# Patient Record
Sex: Female | Born: 1950 | Race: White | Hispanic: No | Smoking: Former smoker
Health system: Southern US, Community
[De-identification: ages and names within clinical notes are randomized; demographics above are authoritative.]

## PROBLEM LIST (undated history)

## (undated) DIAGNOSIS — E039 Hypothyroidism, unspecified: Secondary | ICD-10-CM

## (undated) DIAGNOSIS — R002 Palpitations: Secondary | ICD-10-CM

## (undated) DIAGNOSIS — M1990 Unspecified osteoarthritis, unspecified site: Secondary | ICD-10-CM

## (undated) DIAGNOSIS — I10 Essential (primary) hypertension: Secondary | ICD-10-CM

## (undated) DIAGNOSIS — G4730 Sleep apnea, unspecified: Secondary | ICD-10-CM

## (undated) DIAGNOSIS — S41112A Laceration without foreign body of left upper arm, initial encounter: Secondary | ICD-10-CM

## (undated) DIAGNOSIS — R011 Cardiac murmur, unspecified: Secondary | ICD-10-CM

## (undated) DIAGNOSIS — S81802A Unspecified open wound, left lower leg, initial encounter: Secondary | ICD-10-CM

## (undated) DIAGNOSIS — K219 Gastro-esophageal reflux disease without esophagitis: Secondary | ICD-10-CM

## (undated) DIAGNOSIS — R32 Unspecified urinary incontinence: Secondary | ICD-10-CM

## (undated) DIAGNOSIS — Z8719 Personal history of other diseases of the digestive system: Secondary | ICD-10-CM

## (undated) DIAGNOSIS — I499 Cardiac arrhythmia, unspecified: Secondary | ICD-10-CM

## (undated) HISTORY — PX: CARPAL TUNNEL RELEASE: SHX101

---

## 2002-12-16 HISTORY — PX: HAND FUSION: SHX975

## 2003-12-17 HISTORY — PX: BACK SURGERY: SHX140

## 2006-08-22 ENCOUNTER — Ambulatory Visit (HOSPITAL_BASED_OUTPATIENT_CLINIC_OR_DEPARTMENT_OTHER): Admission: RE | Admit: 2006-08-22 | Discharge: 2006-08-22 | Payer: Self-pay | Admitting: Orthopaedic Surgery

## 2006-10-27 ENCOUNTER — Ambulatory Visit (HOSPITAL_BASED_OUTPATIENT_CLINIC_OR_DEPARTMENT_OTHER): Admission: RE | Admit: 2006-10-27 | Discharge: 2006-10-27 | Payer: Self-pay | Admitting: Orthopaedic Surgery

## 2007-02-04 ENCOUNTER — Ambulatory Visit (HOSPITAL_COMMUNITY): Admission: RE | Admit: 2007-02-04 | Discharge: 2007-02-05 | Payer: Self-pay | Admitting: Orthopaedic Surgery

## 2009-02-13 DEATH — deceased

## 2012-08-07 ENCOUNTER — Ambulatory Visit: Payer: BC Managed Care – PPO | Attending: Family Medicine | Admitting: Sleep Medicine

## 2012-08-07 DIAGNOSIS — G4733 Obstructive sleep apnea (adult) (pediatric): Secondary | ICD-10-CM | POA: Insufficient documentation

## 2012-08-07 DIAGNOSIS — G4730 Sleep apnea, unspecified: Secondary | ICD-10-CM

## 2012-08-07 DIAGNOSIS — Z6832 Body mass index (BMI) 32.0-32.9, adult: Secondary | ICD-10-CM | POA: Insufficient documentation

## 2012-08-13 NOTE — Procedures (Signed)
[facility] Jame Seelig A. Gerilyn Pilgrim, MD     [email or web address]          NAMEDECEMBER, HEDTKE                ACCOUNT NO.:  [id number]  MEDICAL RECORD NO.:  [id number]          PATIENT TYPE:  OUT  LOCATION:  SLEEP LAB                     FACILITY:  [facility]  PHYSICIAN:  Fumio Vandam A. Gerilyn Pilgrim, M.D. DATE OF BIRTH:  09/24/1951  DATE OF STUDY:  08/07/2012                           NOCTURNAL POLYSOMNOGRAM  REFERRING PHYSICIAN:  Raynelle Jan, M.D.  INDICATIONS:  A 61 year old, who presents with obesity, hypersomnia, and fatigue.  The study is being done to evaluate for obstructive sleep apnea syndrome.   MEDICATIONS:  Venlafaxine, pravastatin, levothyroxine, Diovan.  EPWORTH SLEEPINESS SCALE:  13.  BMI:  32.  ARCHITECTURAL SUMMARY:  Total recording time is 424 minutes.  Sleep efficiency 66%.  Sleep latency 51 minutes.  REM latency 158 minutes. Stage N1 12%, N2 66%, N3 80%, and REM sleep 3%  RESPIRATORY SUMMARY:  Baseline oxygen saturation is 95, lowest saturation 88 during non-REM sleep.  Diagnostic AHI 24 and RDI 26.  LIMB MOVEMENT SUMMARY:  PLM index 0.  ELECTROCARDIOGRAM SUMMARY:  Average heart rate is 75 with no significant dysrhythmias observed.  IMPRESSION:  Moderate obstructive sleep apnea syndrome.  RECOMMENDATION:  Formal CPAP titration study.  Thank you for this referral.    Lus Kriegel A. Gerilyn Pilgrim, M.D.    KAD/MEDQ  D:  08/13/2012 09:12:50  T:  08/13/2012 09:41:17  Job:  [id number]

## 2017-02-13 NOTE — H&P (Signed)
  Robin Evans is an 66 y.o. female.    Chief Complaint: right knee pain  HPI: Pt is a 66 y.o. female complaining of right knee pain for multiple years. Pain had continually increased since the beginning. X-rays in the clinic show end-stage arthritic changes of the right knee. Pt has tried various conservative treatments which have failed to alleviate their symptoms, including injections and therapy. Various options are discussed with the patient. Risks, benefits and expectations were discussed with the patient. Patient understand the risks, benefits and expectations and wishes to proceed with surgery.   PCP:  Raynelle JanSPRY,HEATHER M., MD  D/C Plans: Home  PMH: No past medical history on file.  PSH: No past surgical history on file.  Social History:  has no tobacco, alcohol, and drug history on file.  Allergies:  Allergies not on file  Medications: No current facility-administered medications for this encounter.    No current outpatient prescriptions on file.    No results found for this or any previous visit (from the past 48 hour(s)). No results found.  ROS: Pain with rom of the right lower extremity  Physical Exam:  Alert and oriented 66 y.o. female in no acute distress Cranial nerves 2-12 intact Cervical spine: full rom with no tenderness, nv intact distally Chest: active breath sounds bilaterally, no wheeze rhonchi or rales Heart: regular rate and rhythm, no murmur Abd: non tender non distended with active bowel sounds Hip is stable with rom  Right knee medial and lateral joint line tenderness nv intact distally Antalgic gait  Assessment/Plan Assessment: right knee end stage osteoarthritis  Plan: Patient will undergo a right total knee by [facility]. Risks benefits and expectations were discussed with the patient. Patient understand risks, benefits and expectations and wishes to proceed.

## 2017-02-25 NOTE — Pre-Procedure Instructions (Addendum)
Patrecia PaceJoni Warman  02/25/2017      [id number] - [place], [place] - [address] [facility] [id number] Phone: [phone] Fax: [phone]    Your procedure is scheduled on Friday March 23.  [facility] Ad[facility] at 5:30 A.M.  Call this number if you have problems the morning of surgery:  [phone]   Remember:  Do not eat food or drink liquids after midnight.  Take these medicines the morning of surgery with A SIP OF WATER: levothyroxine (synthroid), omeprazole (prilosec)                                              (02/28/17) 7 days prior to surgery STOP taking any Aspirin, Aleve, Naproxen, Ibuprofen, Motrin, Advil, Goody's, BC's, all herbal medications, fish oil, and all vitamins    Do not wear jewelry, make-up or nail polish.  Do not wear lotions, powders, or perfumes, or deoderant.  Do not shave 48 hours prior to surgery.  Men may shave face and neck.  Do not bring valuables to the hospital.  [facility] is not responsible for any belongings or valuables.  Contacts, dentures or bridgework may not be worn into surgery.  Leave your suitcase in the car.  After surgery it may be brought to your room.  For patients admitted to the hospital, discharge time will be determined by your treatment team.  Patients discharged the day of surgery will not be allowed to drive home.    Special instructions:    [facility]- Preparing For Surgery  Before surgery, you can play an important role. Because skin is not sterile, your skin needs to be as free of germs as possible. You can reduce the number of germs on your skin by washing with CHG (chlorahexidine gluconate) Soap before surgery.  CHG is an antiseptic cleaner which kills germs and bonds with the skin to continue killing germs even after washing.  Please do not use if you have an allergy to CHG or antibacterial soaps. If your skin becomes reddened/irritated stop using the CHG.   Do not shave (including legs and underarms) for at least 48 hours prior to first CHG shower. It is OK to shave your face.  Please follow these instructions carefully.   1. Shower the NIGHT BEFORE SURGERY and the MORNING OF SURGERY with CHG.   2. If you chose to wash your hair, wash your hair first as usual with your normal shampoo.  3. After you shampoo, rinse your hair and body thoroughly to remove the shampoo.  4. Use CHG as you would any other liquid soap. You can apply CHG directly to the skin and wash gently with a scrungie or a clean washcloth.   5. Apply the CHG Soap to your body ONLY FROM THE NECK DOWN.  Do not use on open wounds or open sores. Avoid contact with your eyes, ears, mouth and genitals (private parts). Wash genitals (private parts) with your normal soap.  6. Wash thoroughly, paying special attention to the area where your surgery will be performed.  7. Thoroughly rinse your body with warm water from the neck down.  8. DO NOT shower/wash with your normal soap after using and rinsing off the CHG Soap.  9. Pat yourself dry with a CLEAN TOWEL.   10. Wear CLEAN PAJAMAS  11. Place CLEAN SHEETS on your bed the night of your first shower and DO NOT SLEEP WITH PETS.    Day of Surgery: Do not apply any deodorants/lotions. Please wear clean clothes to the hospital/surgery center.      Please read over the  fact sheets that you were given.

## 2017-02-26 ENCOUNTER — Encounter (HOSPITAL_COMMUNITY): Payer: Self-pay | Admitting: *Deleted

## 2017-02-26 ENCOUNTER — Encounter (HOSPITAL_COMMUNITY)
Admission: RE | Admit: 2017-02-26 | Discharge: 2017-02-26 | Disposition: A | Discharge status: Home / Self Care | Payer: Worker's Compensation | Source: Ambulatory Visit | Attending: Orthopedic Surgery | Admitting: Orthopedic Surgery

## 2017-02-26 DIAGNOSIS — Z01812 Encounter for preprocedural laboratory examination: Secondary | ICD-10-CM | POA: Diagnosis present

## 2017-02-26 HISTORY — DX: Sleep apnea, unspecified: G47.30

## 2017-02-26 HISTORY — DX: Cardiac arrhythmia, unspecified: I49.9

## 2017-02-26 HISTORY — DX: Hypothyroidism, unspecified: E03.9

## 2017-02-26 HISTORY — DX: Personal history of other diseases of the digestive system: Z87.19

## 2017-02-26 HISTORY — DX: Essential (primary) hypertension: I10

## 2017-02-26 HISTORY — DX: Gastro-esophageal reflux disease without esophagitis: K21.9

## 2017-02-26 HISTORY — DX: Unspecified osteoarthritis, unspecified site: M19.90

## 2017-02-26 LAB — CBC
HCT: 41.7 % (ref 36.0–46.0)
Hemoglobin: 13.7 g/dL (ref 12.0–15.0)
MCH: 29.3 pg (ref 26.0–34.0)
MCHC: 32.9 g/dL (ref 30.0–36.0)
MCV: 89.3 fL (ref 78.0–100.0)
PLATELETS: 164 10*3/uL (ref 150–400)
RBC: 4.67 MIL/uL (ref 3.87–5.11)
RDW: 13.9 % (ref 11.5–15.5)
WBC: 6 10*3/uL (ref 4.0–10.5)

## 2017-02-26 LAB — SURGICAL PCR SCREEN
MRSA, PCR: NEGATIVE
STAPHYLOCOCCUS AUREUS: POSITIVE — AB

## 2017-02-26 LAB — BASIC METABOLIC PANEL
ANION GAP: 8 (ref 5–15)
BUN: 10 mg/dL (ref 6–20)
CO2: 29 mmol/L (ref 22–32)
CREATININE: 0.83 mg/dL (ref 0.44–1.00)
Calcium: 9.7 mg/dL (ref 8.9–10.3)
Chloride: 103 mmol/L (ref 101–111)
GFR calc Af Amer: >60 mL/min (ref >60)
GFR calc non Af Amer: >60 mL/min (ref >60)
Glucose, Bld: 107 mg/dL — ABNORMAL HIGH (ref 65–99)
POTASSIUM: 3.9 mmol/L (ref 3.5–5.1)
SODIUM: 140 mmol/L (ref 135–145)

## 2017-02-26 NOTE — Progress Notes (Signed)
req'd from pcp dr Herbert Setaheather spry [facility] [phone]

## 2017-03-06 MED ORDER — TRANEXAMIC ACID 1000 MG/10ML IV SOLN
1000.0000 mg | INTRAVENOUS | Status: AC
  Administered 2017-03-07: 1000 mg via INTRAVENOUS
  Filled 2017-03-06: qty 10

## 2017-03-06 NOTE — Anesthesia Preprocedure Evaluation (Addendum)
Anesthesia Evaluation    Reviewed: Allergy & Precautions, Patient's Chart, lab work & pertinent test results  Airway Mallampati: III  TM Distance: >3 FB Neck ROM: Full    Dental  (+) Teeth Intact, Dental Advisory Given   Pulmonary sleep apnea and Continuous Positive Airway Pressure Ventilation , former smoker,    Pulmonary exam normal breath sounds clear to auscultation       Cardiovascular hypertension, Pt. on medications (-) angina(-) CAD, (-) Past MI and (-) CHF Normal cardiovascular exam Rhythm:Regular Rate:Normal     Neuro/Psych negative neurological ROS  negative psych ROS   GI/Hepatic Neg liver ROS, hiatal hernia, GERD  Medicated,  Endo/Other  Hypothyroidism Obesity   Renal/GU negative Renal ROS     Musculoskeletal  (+) Arthritis , Osteoarthritis,    Abdominal   Peds  Hematology negative hematology ROS (+) Plt 164k   Anesthesia Other Findings Day of surgery medications reviewed with the patient.  Reproductive/Obstetrics                            Anesthesia Physical Anesthesia Plan  ASA: II  Anesthesia Plan: Spinal and MAC   Post-op Pain Management:  Regional for Post-op pain   Induction: Intravenous  Airway Management Planned: Simple Face Mask  Additional Equipment:   Intra-op Plan:   Post-operative Plan:   Informed Consent: I have reviewed the patients History and Physical, chart, labs and discussed the procedure including the risks, benefits and alternatives for the proposed anesthesia with the patient or authorized representative who has indicated his/her understanding and acceptance.   Dental advisory given  Plan Discussed with: CRNA, Anesthesiologist and Surgeon  Anesthesia Plan Comments: (Spinal with Adductor canal block.)        Anesthesia Quick Evaluation

## 2017-03-07 ENCOUNTER — Inpatient Hospital Stay (HOSPITAL_COMMUNITY)
Admission: RE | Admit: 2017-03-07 | Discharge: 2017-03-10 | DRG: 470 | Disposition: A | Discharge status: Skilled Nursing Facility | Payer: Worker's Compensation | Source: Ambulatory Visit | Attending: Orthopedic Surgery | Admitting: Orthopedic Surgery

## 2017-03-07 ENCOUNTER — Inpatient Hospital Stay (HOSPITAL_COMMUNITY): Payer: Worker's Compensation | Admitting: Anesthesiology

## 2017-03-07 ENCOUNTER — Inpatient Hospital Stay (HOSPITAL_COMMUNITY): Payer: Worker's Compensation

## 2017-03-07 ENCOUNTER — Encounter (HOSPITAL_COMMUNITY): Payer: Self-pay | Admitting: *Deleted

## 2017-03-07 ENCOUNTER — Encounter (HOSPITAL_COMMUNITY)
Admission: RE | Disposition: A | Discharge status: Skilled Nursing Facility | Payer: Self-pay | Source: Ambulatory Visit | Attending: Orthopedic Surgery

## 2017-03-07 DIAGNOSIS — Z6831 Body mass index (BMI) 31.0-31.9, adult: Secondary | ICD-10-CM

## 2017-03-07 DIAGNOSIS — Z96651 Presence of right artificial knee joint: Secondary | ICD-10-CM

## 2017-03-07 DIAGNOSIS — I10 Essential (primary) hypertension: Secondary | ICD-10-CM | POA: Diagnosis present

## 2017-03-07 DIAGNOSIS — K219 Gastro-esophageal reflux disease without esophagitis: Secondary | ICD-10-CM | POA: Diagnosis present

## 2017-03-07 DIAGNOSIS — E039 Hypothyroidism, unspecified: Secondary | ICD-10-CM | POA: Diagnosis present

## 2017-03-07 DIAGNOSIS — Z87891 Personal history of nicotine dependence: Secondary | ICD-10-CM | POA: Diagnosis not present

## 2017-03-07 DIAGNOSIS — M1711 Unilateral primary osteoarthritis, right knee: Secondary | ICD-10-CM | POA: Diagnosis present

## 2017-03-07 DIAGNOSIS — M1712 Unilateral primary osteoarthritis, left knee: Secondary | ICD-10-CM | POA: Diagnosis present

## 2017-03-07 DIAGNOSIS — E669 Obesity, unspecified: Secondary | ICD-10-CM | POA: Diagnosis present

## 2017-03-07 DIAGNOSIS — M25561 Pain in right knee: Secondary | ICD-10-CM | POA: Diagnosis present

## 2017-03-07 HISTORY — PX: INJECTION KNEE: SHX2446

## 2017-03-07 HISTORY — PX: TOTAL KNEE ARTHROPLASTY: SHX125

## 2017-03-07 SURGERY — ARTHROPLASTY, KNEE, TOTAL
Anesthesia: Spinal | Site: Knee | Laterality: Right

## 2017-03-07 MED ORDER — ASPIRIN 81 MG PO CHEW: 81.0000 mg | CHEWABLE_TABLET | Freq: Two times a day (BID) | ORAL | 0 refills | Status: DC

## 2017-03-07 MED ORDER — IRBESARTAN 150 MG PO TABS
150.0000 mg | ORAL_TABLET | Freq: Every day | ORAL | Status: DC
  Administered 2017-03-08 – 2017-03-10 (×3): 150 mg via ORAL
  Filled 2017-03-07 (×3): qty 1

## 2017-03-07 MED ORDER — OXYCODONE HCL 5 MG PO TABS
5.0000 mg | ORAL_TABLET | ORAL | Status: DC | PRN
  Administered 2017-03-07 (×4): 5 mg via ORAL
  Administered 2017-03-08: 10 mg via ORAL
  Administered 2017-03-08: 5 mg via ORAL
  Administered 2017-03-08: 10 mg via ORAL
  Administered 2017-03-08 – 2017-03-09 (×5): 5 mg via ORAL
  Filled 2017-03-07 (×6): qty 1
  Filled 2017-03-07 (×2): qty 2
  Filled 2017-03-07 (×4): qty 1

## 2017-03-07 MED ORDER — FENTANYL CITRATE (PF) 100 MCG/2ML IJ SOLN
INTRAMUSCULAR | Status: AC
  Filled 2017-03-07: qty 2

## 2017-03-07 MED ORDER — CEFAZOLIN SODIUM-DEXTROSE 2-4 GM/100ML-% IV SOLN
2.0000 g | INTRAVENOUS | Status: AC
  Administered 2017-03-07: 2 g via INTRAVENOUS
  Filled 2017-03-07: qty 100

## 2017-03-07 MED ORDER — DOCUSATE SODIUM 100 MG PO CAPS
100.0000 mg | ORAL_CAPSULE | Freq: Two times a day (BID) | ORAL | Status: DC
  Administered 2017-03-07 – 2017-03-10 (×6): 100 mg via ORAL
  Filled 2017-03-07 (×5): qty 1

## 2017-03-07 MED ORDER — PANTOPRAZOLE SODIUM 40 MG PO TBEC
40.0000 mg | DELAYED_RELEASE_TABLET | Freq: Every day | ORAL | Status: DC
Start: 2017-03-08 — End: 2017-03-10
  Administered 2017-03-08 – 2017-03-10 (×3): 40 mg via ORAL
  Filled 2017-03-07 (×3): qty 1

## 2017-03-07 MED ORDER — ASPIRIN 81 MG PO CHEW
81.0000 mg | CHEWABLE_TABLET | Freq: Two times a day (BID) | ORAL | Status: DC
  Administered 2017-03-07 – 2017-03-10 (×6): 81 mg via ORAL
  Filled 2017-03-07 (×6): qty 1

## 2017-03-07 MED ORDER — LACTATED RINGERS IV SOLN
INTRAVENOUS | Status: DC | PRN
Start: 2017-03-07 — End: 2017-03-07
  Administered 2017-03-07: 07:00:00 via INTRAVENOUS

## 2017-03-07 MED ORDER — TRANEXAMIC ACID 1000 MG/10ML IV SOLN
1000.0000 mg | Freq: Once | INTRAVENOUS | Status: AC
  Administered 2017-03-07: 1000 mg via INTRAVENOUS
  Filled 2017-03-07: qty 10

## 2017-03-07 MED ORDER — ONDANSETRON HCL 4 MG/2ML IJ SOLN: 4.0000 mg | Freq: Once | INTRAMUSCULAR | Status: DC | PRN

## 2017-03-07 MED ORDER — METHOCARBAMOL 500 MG PO TABS
500.0000 mg | ORAL_TABLET | Freq: Four times a day (QID) | ORAL | Status: DC | PRN
  Administered 2017-03-07 – 2017-03-09 (×5): 500 mg via ORAL
  Filled 2017-03-07 (×6): qty 1

## 2017-03-07 MED ORDER — EPHEDRINE SULFATE-NACL 50-0.9 MG/10ML-% IV SOSY
PREFILLED_SYRINGE | INTRAVENOUS | Status: DC | PRN
  Administered 2017-03-07: 5 mg via INTRAVENOUS

## 2017-03-07 MED ORDER — EPHEDRINE 5 MG/ML INJ
INTRAVENOUS | Status: AC
  Filled 2017-03-07: qty 10

## 2017-03-07 MED ORDER — FENTANYL CITRATE (PF) 100 MCG/2ML IJ SOLN
25.0000 ug | INTRAMUSCULAR | Status: DC | PRN
  Administered 2017-03-07 (×3): 50 ug via INTRAVENOUS

## 2017-03-07 MED ORDER — ACETAMINOPHEN 325 MG PO TABS
650.0000 mg | ORAL_TABLET | Freq: Four times a day (QID) | ORAL | Status: DC | PRN
  Administered 2017-03-10 (×2): 650 mg via ORAL
  Filled 2017-03-07 (×2): qty 2

## 2017-03-07 MED ORDER — ONDANSETRON HCL 4 MG PO TABS: 4.0000 mg | ORAL_TABLET | Freq: Four times a day (QID) | ORAL | Status: DC | PRN

## 2017-03-07 MED ORDER — MENTHOL 3 MG MT LOZG: 1.0000 | LOZENGE | OROMUCOSAL | Status: DC | PRN

## 2017-03-07 MED ORDER — MIDAZOLAM HCL 2 MG/2ML IJ SOLN
INTRAMUSCULAR | Status: AC
  Filled 2017-03-07: qty 2

## 2017-03-07 MED ORDER — OXYCODONE-ACETAMINOPHEN 5-325 MG PO TABS: 1.0000 | ORAL_TABLET | ORAL | 0 refills | Status: DC | PRN

## 2017-03-07 MED ORDER — PRAVASTATIN SODIUM 40 MG PO TABS
40.0000 mg | ORAL_TABLET | Freq: Every evening | ORAL | Status: DC
  Administered 2017-03-07 – 2017-03-10 (×4): 40 mg via ORAL
  Filled 2017-03-07 (×4): qty 1

## 2017-03-07 MED ORDER — LIDOCAINE HCL 2 % IJ SOLN
INTRAMUSCULAR | Status: AC
  Filled 2017-03-07: qty 20

## 2017-03-07 MED ORDER — SODIUM CHLORIDE 0.9 % IR SOLN
Status: DC | PRN
  Administered 2017-03-07: 3000 mL

## 2017-03-07 MED ORDER — LEVOTHYROXINE SODIUM 25 MCG PO TABS
125.0000 ug | ORAL_TABLET | Freq: Every day | ORAL | Status: DC
  Administered 2017-03-08 – 2017-03-10 (×3): 125 ug via ORAL
  Filled 2017-03-07 (×3): qty 1

## 2017-03-07 MED ORDER — CHLORHEXIDINE GLUCONATE 4 % EX LIQD: 60.0000 mL | Freq: Once | CUTANEOUS | Status: DC

## 2017-03-07 MED ORDER — METHOCARBAMOL 500 MG PO TABS: 500.0000 mg | ORAL_TABLET | Freq: Three times a day (TID) | ORAL | 1 refills | Status: DC | PRN

## 2017-03-07 MED ORDER — PROPOFOL 500 MG/50ML IV EMUL
INTRAVENOUS | Status: DC | PRN
  Administered 2017-03-07: 100 ug/kg/min via INTRAVENOUS

## 2017-03-07 MED ORDER — PROPOFOL 10 MG/ML IV BOLUS
INTRAVENOUS | Status: AC
  Filled 2017-03-07: qty 20

## 2017-03-07 MED ORDER — ONDANSETRON HCL 4 MG/2ML IJ SOLN: 4.0000 mg | Freq: Four times a day (QID) | INTRAMUSCULAR | Status: DC | PRN

## 2017-03-07 MED ORDER — FENTANYL CITRATE (PF) 100 MCG/2ML IJ SOLN
INTRAMUSCULAR | Status: DC | PRN
  Administered 2017-03-07 (×2): 50 ug via INTRAVENOUS

## 2017-03-07 MED ORDER — METOCLOPRAMIDE HCL 5 MG/ML IJ SOLN: 5.0000 mg | Freq: Three times a day (TID) | INTRAMUSCULAR | Status: DC | PRN

## 2017-03-07 MED ORDER — TRIAMCINOLONE ACETONIDE 40 MG/ML IJ SUSP
INTRAMUSCULAR | Status: AC
  Filled 2017-03-07: qty 5

## 2017-03-07 MED ORDER — ROPIVACAINE HCL 7.5 MG/ML IJ SOLN
INTRAMUSCULAR | Status: DC | PRN
Start: 2017-03-07 — End: 2017-03-07
  Administered 2017-03-07: 20 mL via PERINEURAL

## 2017-03-07 MED ORDER — PHENOL 1.4 % MT LIQD: 1.0000 | OROMUCOSAL | Status: DC | PRN

## 2017-03-07 MED ORDER — OXYCODONE HCL 5 MG PO TABS
ORAL_TABLET | ORAL | Status: AC
  Filled 2017-03-07: qty 1

## 2017-03-07 MED ORDER — METOCLOPRAMIDE HCL 5 MG PO TABS
5.0000 mg | ORAL_TABLET | Freq: Three times a day (TID) | ORAL | Status: DC | PRN
  Filled 2017-03-07: qty 2

## 2017-03-07 MED ORDER — ACETAMINOPHEN 650 MG RE SUPP: 650.0000 mg | Freq: Four times a day (QID) | RECTAL | Status: DC | PRN

## 2017-03-07 MED ORDER — DEXTROSE 5 % IV SOLN
500.0000 mg | Freq: Four times a day (QID) | INTRAVENOUS | Status: DC | PRN
  Filled 2017-03-07: qty 5

## 2017-03-07 MED ORDER — PHENYLEPHRINE HCL 10 MG/ML IJ SOLN
INTRAVENOUS | Status: DC | PRN
  Administered 2017-03-07: 25 ug/min via INTRAVENOUS

## 2017-03-07 MED ORDER — CEFAZOLIN SODIUM-DEXTROSE 2-4 GM/100ML-% IV SOLN
INTRAVENOUS | Status: AC
  Filled 2017-03-07: qty 100

## 2017-03-07 MED ORDER — POLYETHYLENE GLYCOL 3350 17 G PO PACK: 17.0000 g | PACK | Freq: Every day | ORAL | Status: DC | PRN

## 2017-03-07 MED ORDER — MIDAZOLAM HCL 5 MG/5ML IJ SOLN
INTRAMUSCULAR | Status: DC | PRN
  Administered 2017-03-07: 2 mg via INTRAVENOUS

## 2017-03-07 MED ORDER — 0.9 % SODIUM CHLORIDE (POUR BTL) OPTIME
TOPICAL | Status: DC | PRN
  Administered 2017-03-07: 1000 mL

## 2017-03-07 MED ORDER — OXYCODONE-ACETAMINOPHEN 5-325 MG PO TABS
ORAL_TABLET | ORAL | Status: AC
  Filled 2017-03-07: qty 1

## 2017-03-07 MED ORDER — BISACODYL 10 MG RE SUPP: 10.0000 mg | Freq: Every day | RECTAL | Status: DC | PRN

## 2017-03-07 MED ORDER — MORPHINE SULFATE (PF) 4 MG/ML IV SOLN: 3.0000 mg | INTRAVENOUS | Status: DC | PRN

## 2017-03-07 MED ORDER — SODIUM CHLORIDE 0.9 % IV SOLN
INTRAVENOUS | Status: DC
  Administered 2017-03-07: 15:00:00 via INTRAVENOUS

## 2017-03-07 MED ORDER — BUPIVACAINE HCL (PF) 0.5 % IJ SOLN
INTRAMUSCULAR | Status: AC
  Filled 2017-03-07: qty 10

## 2017-03-07 MED ORDER — CEFAZOLIN SODIUM-DEXTROSE 2-4 GM/100ML-% IV SOLN
2.0000 g | Freq: Four times a day (QID) | INTRAVENOUS | Status: AC
  Administered 2017-03-07 (×2): 2 g via INTRAVENOUS
  Filled 2017-03-07 (×2): qty 100

## 2017-03-07 SURGICAL SUPPLY — 69 items
BANDAGE ADH SHEER 1  50/CT (GAUZE/BANDAGES/DRESSINGS) ×4 IMPLANT
BANDAGE ESMARK 6X9 LF (GAUZE/BANDAGES/DRESSINGS) ×2 IMPLANT
BLADE SAG 18X100X1.27 (BLADE) ×4 IMPLANT
BLADE SAW SGTL 13X75X1.27 (BLADE) IMPLANT
BLADE SAW SGTL 18X1.27X75 (BLADE) ×3 IMPLANT
BLADE SAW SGTL 18X1.27X75MM (BLADE) ×1
BLADE SURG 10 STRL SS (BLADE) ×8 IMPLANT
BNDG ELASTIC 6X10 VLCR STRL LF (GAUZE/BANDAGES/DRESSINGS) ×4 IMPLANT
BNDG ESMARK 6X9 LF (GAUZE/BANDAGES/DRESSINGS) ×4
BNDG GAUZE ELAST 4 BULKY (GAUZE/BANDAGES/DRESSINGS) ×8 IMPLANT
BOWL SMART MIX CTS (DISPOSABLE) ×4 IMPLANT
CAPT KNEE TOTAL 3 ATTUNE ×4 IMPLANT
CEMENT HV SMART SET (Cement) ×8 IMPLANT
CHLORAPREP W/TINT 10.5 ML (MISCELLANEOUS) ×4 IMPLANT
CLOSURE WOUND 1/2 X4 (GAUZE/BANDAGES/DRESSINGS) ×1
COVER SURGICAL LIGHT HANDLE (MISCELLANEOUS) ×4 IMPLANT
CUFF TOURNIQUET SINGLE 34IN LL (TOURNIQUET CUFF) ×4 IMPLANT
CUFF TOURNIQUET SINGLE 44IN (TOURNIQUET CUFF) IMPLANT
DRAPE EXTREMITY T 121X128X90 (DRAPE) ×4 IMPLANT
DRAPE PROXIMA HALF (DRAPES) ×4 IMPLANT
DRAPE U-SHAPE 47X51 STRL (DRAPES) ×4 IMPLANT
DRSG ADAPTIC 3X8 NADH LF (GAUZE/BANDAGES/DRESSINGS) ×4 IMPLANT
DRSG PAD ABDOMINAL 8X10 ST (GAUZE/BANDAGES/DRESSINGS) ×4 IMPLANT
DURAPREP 26ML APPLICATOR (WOUND CARE) ×4 IMPLANT
ELECT CAUTERY BLADE 6.4 (BLADE) ×4 IMPLANT
ELECT REM PT RETURN 9FT ADLT (ELECTROSURGICAL) ×4
ELECTRODE REM PT RTRN 9FT ADLT (ELECTROSURGICAL) ×2 IMPLANT
GAUZE SPONGE 4X4 12PLY STRL (GAUZE/BANDAGES/DRESSINGS) ×4 IMPLANT
GLOVE BIOGEL PI ORTHO PRO 7.5 (GLOVE) ×2
GLOVE BIOGEL PI ORTHO PRO SZ7 (GLOVE) ×2
GLOVE BIOGEL PI ORTHO PRO SZ8 (GLOVE) ×2
GLOVE ORTHO TXT STRL SZ7.5 (GLOVE) ×4 IMPLANT
GLOVE PI ORTHO PRO STRL 7.5 (GLOVE) ×2 IMPLANT
GLOVE PI ORTHO PRO STRL SZ7 (GLOVE) ×2 IMPLANT
GLOVE PI ORTHO PRO STRL SZ8 (GLOVE) ×2 IMPLANT
GLOVE SURG ORTHO 8.5 STRL (GLOVE) ×4 IMPLANT
GOWN STRL REUS W/ TWL XL LVL3 (GOWN DISPOSABLE) ×6 IMPLANT
GOWN STRL REUS W/TWL XL LVL3 (GOWN DISPOSABLE) ×6
HANDPIECE INTERPULSE COAX TIP (DISPOSABLE) ×2
IMMOBILIZER KNEE 22 UNIV (SOFTGOODS) ×4 IMPLANT
KIT BASIN OR (CUSTOM PROCEDURE TRAY) ×4 IMPLANT
KIT MANIFOLD (MISCELLANEOUS) ×4 IMPLANT
KIT ROOM TURNOVER OR (KITS) ×4 IMPLANT
MANIFOLD NEPTUNE II (INSTRUMENTS) ×4 IMPLANT
NEEDLE 18GX1X1/2 (RX/OR ONLY) (NEEDLE) ×4 IMPLANT
NEEDLE 22X1 1/2 (OR ONLY) (NEEDLE) ×4 IMPLANT
NS IRRIG 1000ML POUR BTL (IV SOLUTION) ×4 IMPLANT
PACK TOTAL JOINT (CUSTOM PROCEDURE TRAY) ×4 IMPLANT
PACK UNIVERSAL I (CUSTOM PROCEDURE TRAY) IMPLANT
PAD ARMBOARD 7.5X6 YLW CONV (MISCELLANEOUS) ×8 IMPLANT
SET HNDPC FAN SPRY TIP SCT (DISPOSABLE) ×2 IMPLANT
STRIP CLOSURE SKIN 1/2X4 (GAUZE/BANDAGES/DRESSINGS) ×3 IMPLANT
SUCTION FRAZIER HANDLE 10FR (MISCELLANEOUS) ×2
SUCTION TUBE FRAZIER 10FR DISP (MISCELLANEOUS) ×2 IMPLANT
SUT MNCRL AB 3-0 PS2 18 (SUTURE) ×4 IMPLANT
SUT VIC AB 0 CT1 27 (SUTURE) ×6
SUT VIC AB 0 CT1 27XBRD ANBCTR (SUTURE) ×6 IMPLANT
SUT VIC AB 1 CT1 18XCR BRD 8 (SUTURE) ×2 IMPLANT
SUT VIC AB 1 CT1 27 (SUTURE) ×4
SUT VIC AB 1 CT1 27XBRD ANBCTR (SUTURE) ×4 IMPLANT
SUT VIC AB 1 CT1 8-18 (SUTURE) ×2
SUT VIC AB 2-0 CT1 27 (SUTURE) ×4
SUT VIC AB 2-0 CT1 TAPERPNT 27 (SUTURE) ×4 IMPLANT
SYRINGE 20CC LL (MISCELLANEOUS) ×4 IMPLANT
TOWEL OR 17X24 6PK STRL BLUE (TOWEL DISPOSABLE) ×4 IMPLANT
TOWEL OR 17X26 10 PK STRL BLUE (TOWEL DISPOSABLE) ×4 IMPLANT
TRAY CATH 16FR W/PLASTIC CATH (SET/KITS/TRAYS/PACK) IMPLANT
TRAY FOLEY W/METER SILVER 16FR (SET/KITS/TRAYS/PACK) ×4 IMPLANT
WATER STERILE IRR 1000ML POUR (IV SOLUTION) IMPLANT

## 2017-03-07 NOTE — Transfer of Care (Signed)
Immediate Anesthesia Transfer of Care Note  Patient: Robin Evans  Procedure(s) Performed: Procedure(s): RIGHT TOTAL KNEE ARTHROPLASTY (Right) LEFT KNEE INJECTION (Left)  Patient Location: PACU  Anesthesia Type:MAC combined with regional for post-op pain  Level of Consciousness: awake, alert  and oriented  Airway & Oxygen Therapy: Patient Spontanous Breathing and Patient connected to nasal cannula oxygen  Post-op Assessment: Report given to RN and Post -op Vital signs reviewed and stable  Post vital signs: Reviewed and stable  Last Vitals:  Vitals:   03/07/17 0633  BP: (!) 157/89  Pulse: 69  Resp: 20  Temp: 36.8 C    Last Pain:  Vitals:   03/07/17 0633  TempSrc: Oral         Complications: No apparent anesthesia complications

## 2017-03-07 NOTE — Op Note (Signed)
NAME:  Robin Evans,                          ACCOUNT NO.:  MEDICAL RECORD NO.:  [id number]  LOCATION:                                 FACILITY:  PHYSICIAN:  Almedia BallsSteven R. Ranell PatrickNorris, M.D.      DATE OF BIRTH:  DATE OF PROCEDURE:  03/07/2017 DATE OF DISCHARGE:                              OPERATIVE REPORT   PREOPERATIVE DIAGNOSES: 1. Right knee end-stage osteoarthritis. 2. Left knee osteoarthritis.  POSTOPERATIVE DIAGNOSES: 1. Right knee end-stage osteoarthritis. 2. Left knee osteoarthritis.  PROCEDURE PERFORMED: 1. Right total knee replacement using DePuy Attune prosthesis. 2. Left knee intra-articular corticosteroid injection with local     anesthetic.  ATTENDING SURGEON:  Almedia BallsSteven R. Ranell PatrickNorris, MD.  ASSISTANT:  Donnie Coffinhomas B. Dixon, PA-C, who scrubbed the entire procedure and necessary for satisfactory completion of surgery general.  ANESTHESIA:  Spinal anesthesia was used plus adductor canal block.  ESTIMATED BLOOD LOSS:  Minimal.  FLUID REPLACEMENT:  1500 mL crystalloid.  INSTRUMENT COUNTS:  Correct.  COMPLICATIONS:  None.  ANTIBIOTICS:  Perioperative antibiotics were given.  INDICATIONS:  The patient is a 66 year old female with worsening right knee pain.  The patient has disabling pain and poor mobility secondary to end-stage arthritis with bone-on-bone on x-ray.  The patient has had progressive pain despite conservative management.  She also complains of severe left knee pain.  She has had failed corticosteroid injections, modification, activity, pain medication, anti-inflammatories, presents for total knee arthroplasty on the right to restore function and eliminate pain.  She is requesting that we inject her left knee with corticosteroid for management of that knee arthritis in the meantime. Risks and benefits of surgical procedure were discussed.  Informed consent obtained.  DESCRIPTION OF PROCEDURE:  After an adequate level of anesthesia was achieved, the patient was  positioned supine on the operating room table. The right knee was correctly identified and sterilely prepped and draped in the usual manner.  Nonsterile tourniquet placed in proximal thigh prior to prep and drape.  Time-out was called.  We then went ahead and placed the knee in flexion.  Exsanguinated the limb using an Esmarch bandage and elevated the tourniquet to 300 mmHg.  With the knee in flexion and the tourniquet up, we went ahead and made our midline skin incision with a #10 blade scalpel.  Dissection down through subcutaneous tissues using a #10 blade.  We identified the parapatellar tissues and retinaculum and performed our medial parapatellar arthrotomy with a fresh #10 blade scalpel.  We everted the patella, divided the lateral patellofemoral ligaments.  We entered the distal femur with a step-cut drill, placed an intramedullary resection guide resecting 9 mm off set on 3 degrees right.  Once we did our distal femoral cut, then we went ahead and removed ACL, PCL and meniscal tissue; subluxed the tibia anteriorly, placing our retractors for good visualization.  We then used our external alignment jig and cut our tibial cut 3 mm off the affected medial side with an oscillating saw.  Once we had that cut made, we checked our gap in extension, which was of either size 5 or size 6.  So we  were pleased with that.  At this point, we went ahead and completed our femoral preparation, placing knee back in flexion, sizing the femur to a size #6 anterior down.  We placed our referencing off the posterior femoral condyles, placed our guide pins.  We then placed our 4-in-1 block and made our anterior, posterior, and chamfer cuts with that block, removed excess spurs about the knee.  We then went ahead and completed our tibial preparation with the modular drill and keel punch after checking our gaps to make sure they were symmetric.  We also removed our excess osteophytes off the posterior  femoral condyles with a half-inch curved osteotome.  We then did our box cut for posterior cruciate substituting Attune prosthesis with the box cut guide.  We selected the size 6 narrow based on our trial.  Once we placed our size 5 tibia in place, did our modular drill and keel punch, and then impacted that trial in place, marking the midline, we then went ahead and placed our trial femur in place.  We had nice fit with that trial femur and reduced it with a size 6 mm poly.  At this point, we placed the knee in extension.  We used our calipers to check our patellar thickness, which was between 20 and 22 mm thick.  We then resurfaced down to about 13 mm of thickness to get the appropriate parallel cut to the tendon.  We had a nice cut there, not removing any extra bone, but getting a nice flat surface.  We then drilled our lugs for the size 35 patella and placed our trial 35 patella, ranged the knee.  We had good stability and normal patellar tracking with no-touch technique.  We removed all trial components, pulse irrigated, used some bone fragments to stop up the canal hole that we had made in the femur with the intramedullary guide.  We then vacuum mixed cement on the back table with DePuy high-viscosity cement.  We first cemented the tibia and then the femur and then finally the patella.  We placed the knee in extension with the 7 mm spacer and that provided full extension and good stability.  Once the cement was allowed to harden, we removed excess cement with 0.25-inch curved osteotome and once we were sure we had all the cement out, I felt like we were in tiny bit of hyperextension in knee and we had good stability in flexion, thought that we could definitely get the size 8 mm spacer in, so we selected the appropriate size 6 mm spacer, placed the real poly in and then reduced the knee with a nice little click on the medial side.  We had excellent stability, full range of motion and  normal patellar tracking.  We then irrigated the knee thoroughly.  We repaired the parapatellar arthrotomy with #1 Vicryl suture interrupted, followed by 0 and 2-0 layered subcutaneous closure and 4-0 Monocryl for skin, Steri-Strips applied, followed by sterile dressing.  The patient tolerated the surgery well.     Almedia Balls. Ranell Patrick, M.D.     SRN/MEDQ  D:  03/07/2017  T:  03/07/2017  Job:  [id number]

## 2017-03-07 NOTE — Progress Notes (Signed)
Orthopedic Tech Progress Note Patient Details:  Robin Evans 25-Jan-1951 [id number]  CPM Right Knee CPM Right Knee: On Right Knee Flexion (Degrees): 90 Right Knee Extension (Degrees): 0 Additional Comments: trapeze bar patient helper   Nikki DomCrawford, Ethen Bannan 03/07/2017, 10:32 AM viewed order from doctor's order list

## 2017-03-07 NOTE — Anesthesia Postprocedure Evaluation (Signed)
Anesthesia Post Note  Patient: Robin PaceJoni Evans  Procedure(s) Performed: Procedure(s) (LRB): RIGHT TOTAL KNEE ARTHROPLASTY (Right) LEFT KNEE INJECTION (Left)  Patient location during evaluation: PACU Anesthesia Type: Spinal Level of consciousness: oriented and awake and alert Pain management: pain level controlled Vital Signs Assessment: post-procedure vital signs reviewed and stable Respiratory status: spontaneous breathing, respiratory function stable and patient connected to nasal cannula oxygen Cardiovascular status: blood pressure returned to baseline and stable Postop Assessment: no headache, no backache, spinal receding, no signs of nausea or vomiting and patient able to bend at knees Anesthetic complications: no       Last Vitals:  Vitals:   03/07/17 1245 03/07/17 1300  BP:    Pulse: 62 66  Resp: 11 13  Temp:      Last Pain:  Vitals:   03/07/17 1312  TempSrc:   PainSc: Asleep                 Cecile HearingStephen [name] Turk

## 2017-03-07 NOTE — Interval H&P Note (Signed)
History and Physical Interval Note:  03/07/2017 7:23 AM  Robin Evans  has presented today for surgery, with the diagnosis of right knee osteoarthritis  The various methods of treatment have been discussed with the patient and family. After consideration of risks, benefits and other options for treatment, the patient has consented to  Procedure(s): RIGHT TOTAL KNEE ARTHROPLASTY (Right) as a surgical intervention .  The patient's history has been reviewed, patient examined, no change in status, stable for surgery.  I have reviewed the patient's chart and labs.  Questions were answered to the patient's satisfaction.     Ariannie Penaloza,STEVEN R

## 2017-03-07 NOTE — Anesthesia Procedure Notes (Addendum)
Anesthesia Regional Block: Adductor canal block   Pre-Anesthetic Checklist: ,, timeout performed, Correct Patient, Correct Site, Correct Laterality, Correct Procedure, Correct Position, site marked, Risks and benefits discussed,  Surgical consent,  Pre-op evaluation,  At surgeon's request and post-op pain management  Laterality: Right  Prep: chloraprep       Needles:  Injection technique: Single-shot  Needle Type: Echogenic Needle     Needle Length: 9cm  Needle Gauge: 21     Additional Needles:   Procedures: ultrasound guided,,,,,,,,  Narrative:  Start time: 03/07/2017 7:12 AM End time: 03/07/2017 7:14 AM Injection made incrementally with aspirations every 5 mL.  Performed by: Personally  Anesthesiologist: Cecile HearingURK, Joon Pohle [name]  Additional Notes: No pain on injection. No increased resistance to injection. Injection made in 5cc increments.  Good needle visualization.  Patient tolerated procedure well.

## 2017-03-07 NOTE — Discharge Instructions (Signed)
Ice to the knee as much as possible. Do exercises every hour while awake  Use the knee immobilizer at night to keep the knee straight while sleeping. Remove during the knee  DO NOT prop anything behind the knee, prop under the heel or calf to maintain extension.  Dangle knee over bed to get 90 degrees  Keep knee incision clean and dry and covered for one week then ok to get it wet in the shower.  Follow up with Dr Ranell PatrickNorris in two weeks in the office [phone]

## 2017-03-07 NOTE — Progress Notes (Signed)
Pt refuse CPAP for the night. Told patient if changed her mind to let staff know to notify me.

## 2017-03-07 NOTE — Evaluation (Signed)
Physical Therapy Evaluation Patient Details Name: Robin Evans MRN: [id number] DOB: 10/03/1951 Today's Date: 03/07/2017   History of Present Illness  Pt is 66 y/o female s/p R TKA and L knee corticosteriod injection secondary to fall at work. PMH includes sleep apnea, HTN, hypothyroidism, OA, R wrist fusion, and hiatal hernia.   Clinical Impression  Pt is s/p surgery above with deficits below. PTA, pt was independent with all functional mobility tasks. Upon evaluation, pt presenting with post op pain and weakness and required increased assist for gait and basic transfers. Pt lives alone and daughter is only able to assist sporadically. Pt reports the plan is to go to SNF at d/c. Recommending SNF at d/c to increase independence with functional mobility tasks. Will continue to follow to increase independence and safety with mobility.     Follow Up Recommendations SNF;Supervision/Assistance - 24 hour    Equipment Recommendations  Rolling walker with 5" wheels;3in1 (PT)    Recommendations for Other Services       Precautions / Restrictions Precautions Precautions: Knee Precaution Booklet Issued: Yes (comment) Precaution Comments: Reviewed supine ther ex  Required Braces or Orthoses: Knee Immobilizer - Right Knee Immobilizer - Right: Other (comment) (Until discontinued ) Restrictions Weight Bearing Restrictions: Yes RLE Weight Bearing: Weight bearing as tolerated      Mobility  Bed Mobility Overal bed mobility: Needs Assistance Bed Mobility: Supine to Sit     Supine to sit: HOB elevated;Mod assist     General bed mobility comments: Mod A for RLE management. relied heavily on use of bed rails and elevated HOB for trunk elevation.   Transfers Overall transfer level: Needs assistance Equipment used: Rolling walker (2 wheeled) Transfers: Sit to/from Stand Sit to Stand: Mod assist;From elevated surface         General transfer comment: Mod A for lift assist. Verbal cues for  hand placement.   Ambulation/Gait Ambulation/Gait assistance: Mod assist Ambulation Distance (Feet): 5 Feet Assistive device: Rolling walker (2 wheeled) Gait Pattern/deviations: Step-to pattern;Decreased step length - right;Decreased weight shift to right;Antalgic;Wide base of support;Trunk flexed Gait velocity: Decreased  Gait velocity interpretation: Below normal speed for age/gender General Gait Details: Verbal cues for upright posture. Mod A for steadying during ambulation. Ambulated to chair. Unable to tolerate further gait distance secondary to post op pain and weakness.  Stairs            Wheelchair Mobility    Modified Rankin (Stroke Patients Only)       Balance Overall balance assessment: Needs assistance Sitting-balance support: No upper extremity supported;Feet supported Sitting balance-Leahy Scale: Fair     Standing balance support: Bilateral upper extremity supported;During functional activity Standing balance-Leahy Scale: Poor                               Pertinent Vitals/Pain Pain Assessment: 0-10 Pain Score: 8  Pain Location: R knee  Pain Descriptors / Indicators: Aching;Operative site guarding Pain Intervention(s): Limited activity within patient's tolerance;Monitored during session;Repositioned    Home Living Family/patient expects to be discharged to:: Skilled nursing facility Living Arrangements: Alone Available Help at Discharge: Family;Available PRN/intermittently Type of Home: House Home Access: Level entry     Home Layout: One level Home Equipment: None      Prior Function Level of Independence: Independent               Hand Dominance   Dominant Hand: Right    Extremity/Trunk  Assessment   Upper Extremity Assessment Upper Extremity Assessment: Defer to OT evaluation    Lower Extremity Assessment Lower Extremity Assessment: RLE deficits/detail RLE Deficits / Details: Sensory system in tact. Deficits  consistent with post op pain and weakness. Pt able to perform ankle pumps and quad sets this session. Unable to formally assess strength.     Cervical / Trunk Assessment Cervical / Trunk Assessment: Kyphotic  Communication   Communication: No difficulties  Cognition Arousal/Alertness: Awake/alert Behavior During Therapy: WFL for tasks assessed/performed Overall Cognitive Status: Within Functional Limits for tasks assessed                                        General Comments General comments (skin integrity, edema, etc.): Pt reports the plan is to go to rehab before going home to increase independence with functional mobility. Reviewed supine ther ex with pt. Pt oxygen sats at 94% on room air, so oxygen left off at end of session. Notified RN.     Exercises Total Joint Exercises Ankle Circles/Pumps: AROM;Both;10 reps;Supine Quad Sets: AROM;Right;10 reps;Supine Towel Squeeze: AROM;Both;10 reps;Supine   Assessment/Plan    PT Assessment Patient needs continued PT services  PT Problem List Decreased strength;Decreased range of motion;Decreased activity tolerance;Decreased balance;Decreased mobility;Decreased coordination;Decreased knowledge of use of DME;Decreased safety awareness;Decreased knowledge of precautions;Pain       PT Treatment Interventions DME instruction;Gait training;Functional mobility training;Therapeutic activities;Therapeutic exercise;Balance training;Neuromuscular re-education;Patient/family education    PT Goals (Current goals can be found in the Care Plan section)  Acute Rehab PT Goals Patient Stated Goal: to go to rehab to get stronger PT Goal Formulation: With patient Time For Goal Achievement: 03/14/17 Potential to Achieve Goals: Good    Frequency 7X/week   Barriers to discharge Decreased caregiver support No one to assist at home. Daughter can come by sporadically.     Co-evaluation               End of Session Equipment  Utilized During Treatment: Gait belt Activity Tolerance: Patient limited by pain Patient left: in chair;with call bell/phone within reach Nurse Communication: Mobility status PT Visit Diagnosis: Other abnormalities of gait and mobility (R26.89);Pain Pain - Right/Left: Right Pain - part of body: Knee    Time: [id number] PT Time Calculation (min) (ACUTE ONLY): 21 min   Charges:   PT Evaluation $PT Eval Low Complexity: 1 Procedure PT Treatments $Gait Training: 8-22 mins   PT G Codes:        Margot ChimesBrittany Smith, PT, DPT  Acute Rehabilitation Services  Pager: [phone]   Melvyn NovasBrittany L Smith 03/07/2017, 5:39 PM

## 2017-03-07 NOTE — Anesthesia Procedure Notes (Signed)
Procedure Name: MAC Date/Time: 03/07/2017 7:39 AM Performed by: Kyung Rudd Pre-anesthesia Checklist: Patient identified, Emergency Drugs available, Suction available and Patient being monitored Patient Re-evaluated:Patient Re-evaluated prior to inductionOxygen Delivery Method: Simple face mask Intubation Type: IV induction Placement Confirmation: positive ETCO2 Dental Injury: Teeth and Oropharynx as per pre-operative assessment

## 2017-03-07 NOTE — Anesthesia Procedure Notes (Signed)
Spinal  Patient location during procedure: OR Start time: 03/07/2017 7:34 AM End time: 03/07/2017 7:36 AM Staffing Anesthesiologist: Cecile HearingURK, [name] [name] Performed: anesthesiologist  Preanesthetic Checklist Completed: patient identified, surgical consent, pre-op evaluation, timeout performed, IV checked, risks and benefits discussed and monitors and equipment checked Spinal Block Patient position: sitting Prep: site prepped and draped and DuraPrep Patient monitoring: continuous pulse ox and blood pressure Approach: midline Location: L3-4 Needle Needle type: Pencan  Needle gauge: 25 G Needle length: 9 cm Assessment Sensory level: T10 Additional Notes Functioning IV was confirmed and monitors were applied. Sterile prep and drape, including hand hygiene, mask and sterile gloves were used. The patient was positioned and the spine was prepped. The skin was anesthetized with lidocaine.  Free flow of clear CSF was obtained prior to injecting local anesthetic into the CSF.  The spinal needle aspirated freely following injection.  The needle was carefully withdrawn.  The patient tolerated the procedure well. Consent was obtained prior to procedure with all questions answered and concerns addressed. Risks including but not limited to bleeding, infection, nerve damage, paralysis, failed block, inadequate analgesia, allergic reaction, high spinal, itching and headache were discussed and the patient wished to proceed.   Robin AranStephen [name], MD

## 2017-03-07 NOTE — Brief Op Note (Signed)
03/07/2017  9:35 AM  PATIENT:  Patrecia PaceJoni Cottam  66 y.o. female  PRE-OPERATIVE DIAGNOSIS:  right knee osteoarthritis, end stage,   Left knee OA  POST-OPERATIVE DIAGNOSIS:  right knee osteoarthritis, end stage, Left knee OA  PROCEDURE:  Procedure(s): RIGHT TOTAL KNEE ARTHROPLASTY (Right) LEFT KNEE INJECTION (Left) DePuy Attune TKR  SURGEON:  Surgeon(s) and Role:    * Beverely LowSteve Chesley Veasey, MD - Primary  PHYSICIAN ASSISTANT:   ASSISTANTS: Thea Gisthomas B Dixon, PA-C   ANESTHESIA:   regional and spinal  EBL:  Total I/O In: 700 [I.V.:700] Out: 200 [Urine:150; Blood:50]  BLOOD ADMINISTERED:none  DRAINS: none   LOCAL MEDICATIONS USED:  NONE  SPECIMEN:  No Specimen  DISPOSITION OF SPECIMEN:  N/A  COUNTS:  YES  TOURNIQUET:   Total Tourniquet Time Documented: Thigh (Right) - 90 minutes Total: Thigh (Right) - 90 minutes   DICTATION: .Other Dictation: Dictation Number [id number]  PLAN OF CARE: Admit to inpatient   PATIENT DISPOSITION:  PACU - hemodynamically stable.   Delay start of Pharmacological VTE agent (>24hrs) due to surgical blood loss or risk of bleeding: no

## 2017-03-07 NOTE — Care Management Note (Signed)
Case Management Note  Patient Details  Name: Robin Evans MRN: [id number] Date of Birth: Nov 19, 1951  Subjective/Objective:                    Action/Plan:  CM spoke with patient concerning Home Health and DME needs. Patient is under worker's comp and states they did not indicate HH agency Prior to surgery. Patient's adjuster is Felton ClintonKaren Licourt with [employer], [phone], Fax: [phone]. Patient's claim#[id number].  CM left a voice message for Clydie BraunKaren. Will continue to follow.   Expected Discharge Date:                  Expected Discharge Plan:  Home w Home Health Services  In-House Referral:     Discharge planning Services  CM Consult  Post Acute Care Choice:  Home Health, Durable Medical Equipment Choice offered to:   ([facility] worker's comp. [facility]  DME Arranged:  CPM, 3-N-1, Walker rolling DME Agency:  [employer]  HH Arranged:  PT HH Agency:   (to be assigned by worker's comp.)  Status of Service:   in process  If discussed at [employer] Meetings, dates discussed:    Additional Comments:  Durenda GuthrieBrady, Rickey Sadowski Naomi, RN 03/07/2017, 6:10 PM

## 2017-03-08 LAB — CBC
HCT: 38.3 % (ref 36.0–46.0)
Hemoglobin: 12.7 g/dL (ref 12.0–15.0)
MCH: 29.3 pg (ref 26.0–34.0)
MCHC: 33.2 g/dL (ref 30.0–36.0)
MCV: 88.5 fL (ref 78.0–100.0)
Platelets: 166 10*3/uL (ref 150–400)
RBC: 4.33 MIL/uL (ref 3.87–5.11)
RDW: 13.6 % (ref 11.5–15.5)
WBC: 12.7 10*3/uL — AB (ref 4.0–10.5)

## 2017-03-08 LAB — BASIC METABOLIC PANEL
ANION GAP: 8 (ref 5–15)
BUN: 10 mg/dL (ref 6–20)
CALCIUM: 9.1 mg/dL (ref 8.9–10.3)
CO2: 28 mmol/L (ref 22–32)
Chloride: 102 mmol/L (ref 101–111)
Creatinine, Ser: 0.86 mg/dL (ref 0.44–1.00)
GFR calc Af Amer: >60 mL/min (ref >60)
Glucose, Bld: 148 mg/dL — ABNORMAL HIGH (ref 65–99)
POTASSIUM: 4.2 mmol/L (ref 3.5–5.1)
SODIUM: 138 mmol/L (ref 135–145)

## 2017-03-08 MED ORDER — DIPHENHYDRAMINE HCL 12.5 MG/5ML PO ELIX
12.5000 mg | ORAL_SOLUTION | Freq: Four times a day (QID) | ORAL | Status: DC | PRN
  Administered 2017-03-08 – 2017-03-09 (×2): 25 mg via ORAL
  Filled 2017-03-08 (×3): qty 10

## 2017-03-08 NOTE — Progress Notes (Signed)
   Subjective: 1 Day Post-Op Procedure(s) (LRB): RIGHT TOTAL KNEE ARTHROPLASTY (Right) LEFT KNEE INJECTION (Left)  Pt doing fairly well Mild to moderate pain in the knee Currently in cpm moving knee well Patient reports pain as moderate.  Objective:   VITALS:   Vitals:   03/07/17 2200 03/08/17 0500  BP: 128/62 134/67  Pulse: 84 70  Resp:  15  Temp: 98.1 F (36.7 C) 97.6 F (36.4 C)    Right knee dressing intact nv intact distally No rashes or edema  rom good this morning  LABS  Recent Labs  03/08/17 0600  HGB 12.7  HCT 38.3  WBC 12.7*  PLT 166     Recent Labs  03/08/17 0600  NA 138  K 4.2  BUN 10  CREATININE 0.86  GLUCOSE 148*     Assessment/Plan: 1 Day Post-Op Procedure(s) (LRB): RIGHT TOTAL KNEE ARTHROPLASTY (Right) LEFT KNEE INJECTION (Left) PT/Ot Pain management Pulmonary toilet    Brad Madisynn Plair, MPAS, PA-C  03/08/2017, 9:04 AM

## 2017-03-08 NOTE — Progress Notes (Signed)
Patient states she will have RN contact RT when ready for CPAP.

## 2017-03-08 NOTE — Progress Notes (Signed)
qPhysical Therapy Treatment Patient Details Name: Robin Evans MRN: [id number] DOB: September 04, 1951 Today's Date: 03/08/2017    History of Present Illness Pt is 66 y/o female s/p elective R TKA and L knee corticosteriod injection secondary to fall at work. PMH includes sleep apnea, HTN, hypothyroidism, OA, R wrist fusion, and hiatal hernia.     PT Comments    Pt is POD 1 and moving well with therapy. Pt is able to increased gait distance and requires decreased assistance for bed mobility and transfers. Pt will benefit from continued acute services to progress strength and functional gains.     Follow Up Recommendations  SNF;Supervision/Assistance - 24 hour     Equipment Recommendations  Rolling walker with 5" wheels;3in1 (PT)    Recommendations for Other Services       Precautions / Restrictions Precautions Precautions: Knee Precaution Booklet Issued: Yes (comment) Precaution Comments: Reviewed supine ther ex  Required Braces or Orthoses: Knee Immobilizer - Right Knee Immobilizer - Right: On at all times Restrictions Weight Bearing Restrictions: Yes RLE Weight Bearing: Weight bearing as tolerated    Mobility  Bed Mobility Overal bed mobility: Needs Assistance Bed Mobility: Supine to Sit     Supine to sit: HOB elevated;Min assist     General bed mobility comments: MIn A with cues for sequencing. Min A to bring RLE EOB  Transfers Overall transfer level: Needs assistance Equipment used: Rolling walker (2 wheeled) Transfers: Sit to/from Stand Sit to Stand: Min assist         General transfer comment: Min A for safety from EOB  Ambulation/Gait Ambulation/Gait assistance: Min assist Ambulation Distance (Feet): 30 Feet Assistive device: Rolling walker (2 wheeled) Gait Pattern/deviations: Step-to pattern;Decreased step length - right;Decreased weight shift to right;Antalgic;Wide base of support;Trunk flexed Gait velocity: Decreased  Gait velocity interpretation: Below  normal speed for age/gender General Gait Details: Cues for sequencing, upright posture and Min A for stabilizing at trunk and assisting with IV pole.    Stairs            Wheelchair Mobility    Modified Rankin (Stroke Patients Only)       Balance Overall balance assessment: Needs assistance Sitting-balance support: No upper extremity supported;Feet supported Sitting balance-Leahy Scale: Good     Standing balance support: Bilateral upper extremity supported;During functional activity Standing balance-Leahy Scale: Poor Standing balance comment: Relies on RW for stability in standing                            Cognition Arousal/Alertness: Awake/alert Behavior During Therapy: WFL for tasks assessed/performed Overall Cognitive Status: Within Functional Limits for tasks assessed                                        Exercises Total Joint Exercises Quad Sets: AROM;Right;10 reps;Supine Heel Slides: AAROM;Right;10 reps;Supine Goniometric ROM: 5-65    General Comments        Pertinent Vitals/Pain Pain Assessment: 0-10 Pain Score: 7  Pain Location: R knee  Pain Descriptors / Indicators: Aching;Operative site guarding Pain Intervention(s): Monitored during session;Limited activity within patient's tolerance;Repositioned;Ice applied;Patient requesting pain meds-RN notified    Home Living                      Prior Function            PT Goals (  current goals can now be found in the care plan section) Acute Rehab PT Goals Patient Stated Goal: to go to rehab to get stronger Progress towards PT goals: Progressing toward goals    Frequency    7X/week      PT Plan Current plan remains appropriate    Co-evaluation             End of Session Equipment Utilized During Treatment: Gait belt;Right knee immobilizer Activity Tolerance: Patient tolerated treatment well Patient left: in chair;with call bell/phone within  reach;with family/visitor present Nurse Communication: Patient requests pain meds PT Visit Diagnosis: Other abnormalities of gait and mobility (R26.89);Pain;Other (comment) (Decreased ROM right Knee) Pain - Right/Left: Right Pain - part of body: Knee     Time: [id number] PT Time Calculation (min) (ACUTE ONLY): 25 min  Charges:  $Gait Training: 8-22 mins $Therapeutic Exercise: 8-22 mins                    G Codes:       Robin BroachSabra M. Courtni Evans PT, DPT  [phone]    Robin Evans 03/08/2017, 10:42 AM

## 2017-03-08 NOTE — Progress Notes (Signed)
Offered Pt a bath. Pt agreed to bath and daughter request to assist Pt with bath. Tech informed Daughter and Pt that the daughter did not have to assist with bath; Tech will assist with bath. Daughter insisted to assist Pt with bath. Tech gave all bath supplies to Pt.

## 2017-03-08 NOTE — Progress Notes (Signed)
RT made second attempt to place CPAP on patient. Patient stated she was ready and stated she had her own unit here with her in her suitcase. Patient had RT check suitcase but CPAP unit was not there. RT told patient we could bring one of our machines up for her tonight but patient refused and said she would have a family member bring hers tomorrow.

## 2017-03-08 NOTE — Evaluation (Signed)
Occupational Therapy Evaluation Patient Details Name: Robin Evans MRN: [id number] DOB: 22-Mar-1951 Today's Date: 03/08/2017    History of Present Illness Pt is 66 y/o female s/p elective R TKA and L knee corticosteriod injection secondary to fall at work. PMH includes sleep apnea, HTN, hypothyroidism, OA, R wrist fusion, and hiatal hernia.    Clinical Impression   PTA, pt independent with all ADL and functional mobility. Pt currently requires min guard for ADL and functional transfers due to increased RLE pain causing significant balance deficits. Pt plans to d/c to facility for short-term rehab stay prior to returning home. Discussed knee precautions, gradual activity progression, and fall prevention strategies with pt and her daughter. Pt will benefit from continued acute OT services to increase independence and safety with ADL and functional mobility. Will continue to follow acutely.   Follow Up Recommendations  SNF    Equipment Recommendations  3 in 1 bedside commode    Recommendations for Other Services       Precautions / Restrictions Precautions Precautions: Knee Precaution Booklet Issued: No Precaution Comments: Reviewed not placing pillow, towel or other objects under R knee Required Braces or Orthoses: Knee Immobilizer - Right Knee Immobilizer - Right: On at all times Restrictions Weight Bearing Restrictions: Yes RLE Weight Bearing: Weight bearing as tolerated      Mobility Bed Mobility Overal bed mobility: Needs Assistance Bed Mobility: Supine to Sit     Supine to sit: HOB elevated;Min assist     General bed mobility comments: Pt up in chair on OT arrival  Transfers Overall transfer level: Needs assistance Equipment used: Rolling walker (2 wheeled) Transfers: Sit to/from Stand Sit to Stand: Min guard         General transfer comment: Min guard assist for safety and balance.     Balance Overall balance assessment: Needs assistance Sitting-balance  support: No upper extremity supported;Feet supported Sitting balance-Leahy Scale: Good     Standing balance support: Bilateral upper extremity supported;During functional activity Standing balance-Leahy Scale: Poor Standing balance comment: Relies on RW for stability in standing                           ADL either performed or assessed with clinical judgement   ADL Overall ADL's : Needs assistance/impaired         Upper Body Bathing: Set up;Sitting   Lower Body Bathing: Minimal assistance;Sit to/from stand;Cueing for compensatory techniques   Upper Body Dressing : Set up;Sitting   Lower Body Dressing: Minimal assistance;Cueing for compensatory techniques;Sit to/from stand   Toilet Transfer: Min guard;Cueing for safety;Ambulation;RW   Toileting- Architect and Hygiene: Min guard;Sit to/from stand       Functional mobility during ADLs: Min guard;Rolling walker General ADL Comments: Educated on dressing/bathing compensatory strategies and options for bathroom DME. Daughter present for OT eval.     Vision Baseline Vision/History: Wears glasses Wears Glasses: At all times Patient Visual Report: No change from baseline Vision Assessment?: No apparent visual deficits     Perception     Praxis      Pertinent Vitals/Pain Pain Assessment: 0-10 Pain Score: 9  Pain Location: R knee  Pain Descriptors / Indicators: Aching;Operative site guarding Pain Intervention(s): Limited activity within patient's tolerance;Monitored during session;Ice applied;Repositioned     Hand Dominance Right   Extremity/Trunk Assessment Upper Extremity Assessment Upper Extremity Assessment: Overall WFL for tasks assessed   Lower Extremity Assessment Lower Extremity Assessment: RLE deficits/detail RLE Deficits /  Details: decreased strength and AROM as expected post op RLE: Unable to fully assess due to immobilization   Cervical / Trunk Assessment Cervical / Trunk  Assessment: Normal   Communication Communication Communication: No difficulties   Cognition Arousal/Alertness: Awake/alert Behavior During Therapy: WFL for tasks assessed/performed Overall Cognitive Status: Within Functional Limits for tasks assessed                                     General Comments       Exercises Exercises: Total Joint Total Joint Exercises Quad Sets: AROM;Right;10 reps;Supine Heel Slides: AAROM;Right;10 reps;Supine Goniometric ROM: 5-65   Shoulder Instructions      Home Living Family/patient expects to be discharged to:: Skilled nursing facility Living Arrangements: Alone Available Help at Discharge: Family;Available PRN/intermittently (Daughter available at night) Type of Home: House Home Access: Level entry     Home Layout: One level     Bathroom Shower/Tub: Chief Strategy OfficerTub/shower unit   Bathroom Toilet: Handicapped height     Home Equipment: None          Prior Functioning/Environment Level of Independence: Independent                 OT Problem List: Decreased strength;Decreased range of motion;Decreased activity tolerance;Impaired balance (sitting and/or standing);Decreased safety awareness;Decreased knowledge of use of DME or AE;Decreased knowledge of precautions;Pain      OT Treatment/Interventions: Self-care/ADL training;Therapeutic exercise;DME and/or AE instruction;Therapeutic activities;Patient/family education;Balance training    OT Goals(Current goals can be found in the care plan section) Acute Rehab OT Goals Patient Stated Goal: to go to rehab to get stronger OT Goal Formulation: With patient Time For Goal Achievement: 03/22/17 Potential to Achieve Goals: Good ADL Goals Pt Will Perform Grooming: with supervision;standing Pt Will Perform Lower Body Bathing: with set-up;sit to/from stand Pt Will Perform Lower Body Dressing: sit to/from stand;with set-up Pt Will Transfer to Toilet: with  supervision;ambulating;bedside commode Pt Will Perform Toileting - Clothing Manipulation and hygiene: with supervision;sit to/from stand  OT Frequency: Min 2X/week   Barriers to D/C: Decreased caregiver support          Co-evaluation              End of Session Equipment Utilized During Treatment: Gait belt;Rolling walker;Right knee immobilizer CPM Right Knee CPM Right Knee: Off Nurse Communication: Mobility status  Activity Tolerance: Patient limited by pain Patient left: in chair;with call bell/phone within reach;Other (comment) (towel under heel; cannot tolerate 0 degree bone foam)  OT Visit Diagnosis: Unsteadiness on feet (R26.81)                Time: [id number] OT Time Calculation (min): 12 min Charges:  OT General Charges $OT Visit: 1 Procedure OT Evaluation $OT Eval Moderate Complexity: 1 Procedure G-Codes:      Nils PyleJulia Jackalyn Haith, MS, OTR/L 03/08/2017, 11:54 AM

## 2017-03-08 NOTE — Progress Notes (Signed)
  Patient has continued complaints of flushed face/chest and feeling warm.  Patient is afebrile but will continue to monitor.  Benadryl 25mg  po given at 1531.

## 2017-03-08 NOTE — Progress Notes (Signed)
Received return call from L[place]Brad Dixon, [place].  Contacted earlier due to flushing of face after taking Oxycodone 10mg .  Patient denies itching and nausea.  Ordered Benadryl.  Will continue Oxycodone.  Will continue to monitor patient.

## 2017-03-09 LAB — CBC
HEMATOCRIT: 36.9 % (ref 36.0–46.0)
HEMOGLOBIN: 12.4 g/dL (ref 12.0–15.0)
MCH: 29.7 pg (ref 26.0–34.0)
MCHC: 33.6 g/dL (ref 30.0–36.0)
MCV: 88.5 fL (ref 78.0–100.0)
Platelets: 177 10*3/uL (ref 150–400)
RBC: 4.17 MIL/uL (ref 3.87–5.11)
RDW: 14.1 % (ref 11.5–15.5)
WBC: 13.3 10*3/uL — AB (ref 4.0–10.5)

## 2017-03-09 NOTE — Progress Notes (Signed)
   Subjective: 2 Days Post-Op Procedure(s) (LRB): RIGHT TOTAL KNEE ARTHROPLASTY (Right) LEFT KNEE INJECTION (Left)  Pt doing better today Mild to moderate pain in the knee CPM dc'd   Objective:   VITALS:   Vitals:   03/08/17 2024 03/09/17 0445  BP: (!) 148/69 (!) 166/70  Pulse: 75 86  Resp: 18 16  Temp: 99 F (37.2 C) 99 F (37.2 C)    Right knee dressing intact nv intact distally No rashes or edema  rom good this morning, 10 short of full extension  LABS  Recent Labs  03/08/17 0600 03/09/17 0438  HGB 12.7 12.4  HCT 38.3 36.9  WBC 12.7* 13.3*  PLT 166 177     Recent Labs  03/08/17 0600  NA 138  K 4.2  BUN 10  CREATININE 0.86  GLUCOSE 148*     Assessment/Plan: 2 Days Post-Op Procedure(s) (LRB): RIGHT TOTAL KNEE ARTHROPLASTY (Right) LEFT KNEE INJECTION (Left) PT/Ot Pain management Pulmonary toilet Asa and scd for dvt ppx Plan for dc to SNF tomorrow if able     Yolonda KidaJason Patrick Rogers   03/09/2017, 8:38 AM

## 2017-03-09 NOTE — Progress Notes (Signed)
Orthopedic Tech Progress Note Patient Details:  Robin PaceJoni Evans 27-Jan-1951 [id number]  CPM Right Knee CPM Right Knee: On Right Knee Flexion (Degrees): 90 Right Knee Extension (Degrees): 0 Additional Comments: trapeze bar patient helper   Robin FordyceJennifer C Kayron Evans 03/09/2017, 11:57 AM

## 2017-03-09 NOTE — Progress Notes (Signed)
qPhysical Therapy Treatment Patient Details Name: Robin Evans MRN: [id number] DOB: November 08, 1951 Today's Date: 03/09/2017    History of Present Illness Pt is 66 y/o female s/p elective R TKA and L knee corticosteriod injection secondary to fall at work. PMH includes sleep apnea, HTN, hypothyroidism, OA, R wrist fusion, and hiatal hernia.     PT Comments    Patient is making progress toward mobility goals. Able to tolerate increase in gait distance however relies heavily on RW for support.  Continue to progress as tolerated with anticipated d/c to SNF for further skilled PT services.    Follow Up Recommendations  SNF;Supervision/Assistance - 24 hour     Equipment Recommendations  Rolling walker with 5" wheels;3in1 (PT)    Recommendations for Other Services       Precautions / Restrictions Precautions Precautions: Knee Precaution Comments: reviewed precautions/positioning Restrictions Weight Bearing Restrictions: Yes RLE Weight Bearing: Weight bearing as tolerated    Mobility  Bed Mobility Overal bed mobility: Needs Assistance Bed Mobility: Supine to Sit     Supine to sit: Min guard     General bed mobility comments: min guard for safety; use of trapeze bar and increased time/effort required  Transfers Overall transfer level: Needs assistance Equipment used: Rolling walker (2 wheeled) Transfers: Sit to/from Stand Sit to Stand: Min assist         General transfer comment: cues for safe hand placement from EOB and BSC  Ambulation/Gait Ambulation/Gait assistance: Min assist Ambulation Distance (Feet): 75 Feet Assistive device: Rolling walker (2 wheeled) Gait Pattern/deviations: Step-to pattern;Decreased step length - right;Decreased weight shift to right;Antalgic;Wide base of support;Trunk flexed;Decreased stance time - right Gait velocity: Decreased    General Gait Details: pt with heavy reliance on RW; cues for posture, sequencing of gait with use of AD, and  proximity of RW; pt with improved step through pattern with increased distance    Stairs            Wheelchair Mobility    Modified Rankin (Stroke Patients Only)       Balance Overall balance assessment: Needs assistance Sitting-balance support: No upper extremity supported;Feet supported Sitting balance-Leahy Scale: Good     Standing balance support: Bilateral upper extremity supported;During functional activity Standing balance-Leahy Scale: Poor Standing balance comment: Relies on RW for stability in standing                            Cognition Arousal/Alertness: Awake/alert Behavior During Therapy: WFL for tasks assessed/performed Overall Cognitive Status: Within Functional Limits for tasks assessed                                        Exercises Total Joint Exercises Quad Sets: AROM;Right;10 reps;Supine Heel Slides: AAROM;Right;10 reps;Supine Goniometric ROM: 5-72    General Comments        Pertinent Vitals/Pain Pain Assessment: 0-10 Pain Score: 8  Pain Location: R knee with mobility Pain Descriptors / Indicators: Aching;Grimacing;Guarding;Sore Pain Intervention(s): Limited activity within patient's tolerance;Monitored during session;Repositioned;RN gave pain meds during session;Ice applied    Home Living                      Prior Function            PT Goals (current goals can now be found in the care plan section) Progress towards PT  goals: Progressing toward goals    Frequency    7X/week      PT Plan Current plan remains appropriate    Co-evaluation             End of Session Equipment Utilized During Treatment: Gait belt Activity Tolerance: Patient tolerated treatment well Patient left: in chair;with call bell/phone within reach;with family/visitor present Nurse Communication: Mobility status PT Visit Diagnosis: Other abnormalities of gait and mobility (R26.89);Pain;Other (comment)  (Decreased ROM right Knee) Pain - Right/Left: Right Pain - part of body: Knee     Time: 7846-9629 PT Time Calculation (min) (ACUTE ONLY): 27 min  Charges:  $Gait Training: 8-22 mins $Therapeutic Exercise: 8-22 mins                    G Codes:       Erline Levine, PTA Pager: [phone]     Carolynne Edouard 03/09/2017, 4:14 PM

## 2017-03-09 NOTE — Progress Notes (Signed)
Patient refusing the use of CPAP tonight. Patient has her own mask at bedside. RT informed patient we could bring up our machine and she could wear her own mask but she said she is ok and doesn't want to wear it at this time. RT informed patient if she changes her mind have RN contact RT.

## 2017-03-10 ENCOUNTER — Encounter (HOSPITAL_COMMUNITY): Payer: Self-pay | Admitting: Orthopedic Surgery

## 2017-03-10 LAB — CBC
HCT: 39 % (ref 36.0–46.0)
Hemoglobin: 12.8 g/dL (ref 12.0–15.0)
MCH: 29.2 pg (ref 26.0–34.0)
MCHC: 32.8 g/dL (ref 30.0–36.0)
MCV: 89 fL (ref 78.0–100.0)
PLATELETS: 170 10*3/uL (ref 150–400)
RBC: 4.38 MIL/uL (ref 3.87–5.11)
RDW: 14 % (ref 11.5–15.5)
WBC: 11.1 10*3/uL — ABNORMAL HIGH (ref 4.0–10.5)

## 2017-03-10 NOTE — Discharge Summary (Signed)
Physician Discharge Summary   Patient ID: Robin Evans MRN: [id number] DOB/AGE: 65-Jun-1952 65 y.o.  Admit date: 03/07/2017 Discharge date: 03/10/2017  Admission Diagnoses:  Active Problems:   H/O total knee replacement, right   Discharge Diagnoses:  Same   Surgeries: Procedure(s): RIGHT TOTAL KNEE ARTHROPLASTY LEFT KNEE INJECTION on 03/07/2017   Consultants: PT/OT  Discharged Condition: Stable  Hospital Course: Robin Evans is an 66 y.o. female who was admitted 03/07/2017 with a chief complaint of right knee pain , and found to have a diagnosis of right knee end stage osteoarthritis.  They were brought to the operating room on 03/07/2017 and underwent the above named procedures.    The patient had an uncomplicated hospital course and was stable for discharge.  Recent vital signs:  Vitals:   03/09/17 2127 03/10/17 0649  BP: (!) 144/66 (!) 155/82  Pulse: 87 94  Resp: 16 16  Temp: 98.8 F (37.1 C) 98.4 F (36.9 C)    Recent laboratory studies:  Results for orders placed or performed during the hospital encounter of 03/07/17  CBC  Result Value Ref Range   WBC 12.7 (H) 4.0 - 10.5 K/uL   RBC 4.33 3.87 - 5.11 MIL/uL   Hemoglobin 12.7 12.0 - 15.0 g/dL   HCT 82.9 56.2 - 13.0 %   MCV 88.5 78.0 - 100.0 fL   MCH 29.3 26.0 - 34.0 pg   MCHC 33.2 30.0 - 36.0 g/dL   RDW 86.5 78.4 - 69.6 %   Platelets 166 150 - 400 K/uL  Basic metabolic panel  Result Value Ref Range   Sodium 138 135 - 145 mmol/L   Potassium 4.2 3.5 - 5.1 mmol/L   Chloride 102 101 - 111 mmol/L   CO2 28 22 - 32 mmol/L   Glucose, Bld 148 (H) 65 - 99 mg/dL   BUN 10 6 - 20 mg/dL   Creatinine, Ser 2.95 0.44 - 1.00 mg/dL   Calcium 9.1 8.9 - 28.4 mg/dL   GFR calc non Af Amer >60 >60 mL/min   GFR calc Af Amer >60 >60 mL/min   Anion gap 8 5 - 15  CBC  Result Value Ref Range   WBC 13.3 (H) 4.0 - 10.5 K/uL   RBC 4.17 3.87 - 5.11 MIL/uL   Hemoglobin 12.4 12.0 - 15.0 g/dL   HCT 13.2 44.0 - 10.2 %   MCV 88.5 78.0 -  100.0 fL   MCH 29.7 26.0 - 34.0 pg   MCHC 33.6 30.0 - 36.0 g/dL   RDW 72.5 36.6 - 44.0 %   Platelets 177 150 - 400 K/uL  CBC  Result Value Ref Range   WBC 11.1 (H) 4.0 - 10.5 K/uL   RBC 4.38 3.87 - 5.11 MIL/uL   Hemoglobin 12.8 12.0 - 15.0 g/dL   HCT 34.7 42.5 - 95.6 %   MCV 89.0 78.0 - 100.0 fL   MCH 29.2 26.0 - 34.0 pg   MCHC 32.8 30.0 - 36.0 g/dL   RDW 38.7 56.4 - 33.2 %   Platelets 170 150 - 400 K/uL    Discharge Medications:   Allergies as of 03/10/2017      Reactions   Vancomycin Hives, Itching      Medication List    TAKE these medications   aspirin 81 MG chewable tablet Commonly known as:  ASPIRIN CHILDRENS Chew 1 tablet (81 mg total) by mouth 2 (two) times daily.   levothyroxine 125 MCG tablet Commonly known as:  SYNTHROID, LEVOTHROID Take  125 mcg by mouth daily.   methocarbamol 500 MG tablet Commonly known as:  ROBAXIN Take 1 tablet (500 mg total) by mouth 3 (three) times daily as needed.   omeprazole 20 MG capsule Commonly known as:  PRILOSEC Take 20 mg by mouth daily as needed.   oxyCODONE-acetaminophen 5-325 MG tablet Commonly known as:  ROXICET Take 1-2 tablets by mouth every 4 (four) hours as needed for severe pain.   pravastatin 40 MG tablet Commonly known as:  PRAVACHOL Take 40 mg by mouth every evening.   valsartan 160 MG tablet Commonly known as:  DIOVAN Take 160 mg by mouth daily.       Diagnostic Studies: Dg Knee Right Port  Result Date: 03/07/2017 CLINICAL DATA:  Status post total knee replacement EXAM: PORTABLE RIGHT KNEE - 1-2 VIEW COMPARISON:  None. FINDINGS: Frontal and lateral views were obtained. Patient is status post total knee replacement with femoral and tibial prosthetic components well-seated. No fracture or dislocation. Air is noted within the knee joint, an expected postoperative finding. There is a spur along the anterior superior patella. IMPRESSION: Prosthetic components well-seated. No acute fracture or dislocation.  Probable distal quadriceps tendinosis with spurring along the superior anterior patella. Electronically Signed   By: Bretta BangWilliam  Woodruff III M.D.   On: 03/07/2017 10:21    Disposition:   Discharge Instructions    Call MD / Call 911    Complete by:  As directed    If you experience chest pain or shortness of breath, CALL 911 and be transported to the hospital emergency room.  If you develope a fever above 101 F, pus (white drainage) or increased drainage or redness at the wound, or calf pain, call your surgeon's office.   Constipation Prevention    Complete by:  As directed    Drink plenty of fluids.  Prune juice may be helpful.  You may use a stool softener, such as Colace (over the counter) 100 mg twice a day.  Use MiraLax (over the counter) for constipation as needed.   Diet - low sodium heart healthy    Complete by:  As directed    Increase activity slowly as tolerated    Complete by:  As directed       Follow-up Information    NORRIS,STEVEN R, MD. Call in 2 weeks.   Specialty:  Orthopedic Surgery Why:  [phone] Contact information: [address] [place] [address] [phone]            Signed: Thea Gisthomas B Dixon 03/10/2017, 8:10 AM

## 2017-03-10 NOTE — Clinical Social Work Note (Signed)
RN notified CSW that daughter had requested CSW call her. CSW spoke with pt's daughter via telephone. Pt is from home. Pt will go to SNF at d/c. Pt's daughter prefers [facility] at this time. 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 Pt will d/c to [facility] once authorization is received. [facility] notified. Pt's daughter concerned that her mom needs to d/c today because pt's daughter has to work tomorrow and pt's daughter wants to be able to help her get settled into facility. Education officer, environmentalacility director as well as CSW assured pt's daughter that as soon as we received auth we will send pt to [facility].   [address]Robin Evans, [place] [phone]

## 2017-03-10 NOTE — Progress Notes (Signed)
Pt discharge education and instructions completed with pt and daughter at bedside; both voices understanding and denied any questions. Pt TEDS on; pt handed her prescriptions Roxicet and Robaxin; pt to pick up electronically sent aspirin from preferred pharmacy on file. Pt home DME equipments delivered to pt at bedside. Pt transported off unit via wheelchair with belongings and daughter to the side. Robin BucyP. Robin Vanna Sailer RN

## 2017-03-10 NOTE — Progress Notes (Signed)
qPhysical Therapy Treatment Patient Details Name: Robin Evans MRN: [id number] DOB: Mar 19, 1951 Today's Date: 03/10/2017    History of Present Illness Pt is 66 y/o female s/p elective R TKA and L knee corticosteriod injection secondary to fall at work. PMH includes sleep apnea, HTN, hypothyroidism, OA, R wrist fusion, and hiatal hernia.     PT Comments    Patient continues to make steady progress toward mobility goals. Current plan remains appropriate.    Follow Up Recommendations  SNF;Supervision/Assistance - 24 hour     Equipment Recommendations  Rolling walker with 5" wheels;3in1 (PT)    Recommendations for Other Services       Precautions / Restrictions Precautions Precautions: Knee Precaution Comments: reviewed precautions/positioning Restrictions Weight Bearing Restrictions: Yes RLE Weight Bearing: Weight bearing as tolerated    Mobility  Bed Mobility               General bed mobility comments: pt OOB in chair upon arrival  Transfers Overall transfer level: Needs assistance Equipment used: Rolling walker (2 wheeled) Transfers: Sit to/from Stand Sit to Stand: Min guard         General transfer comment: pt with safe hand placement; difficulty with eccentric loading; cues for technique  Ambulation/Gait Ambulation/Gait assistance: Min assist Ambulation Distance (Feet): 100 Feet Assistive device: Rolling walker (2 wheeled) Gait Pattern/deviations: Step-to pattern;Decreased step length - right;Decreased weight shift to right;Antalgic;Wide base of support;Trunk flexed;Decreased stance time - right Gait velocity: Decreased    General Gait Details: cues for posture; pt with improved step length symmetry this session; relies heavily on RW   Stairs            Wheelchair Mobility    Modified Rankin (Stroke Patients Only)       Balance Overall balance assessment: Needs assistance Sitting-balance support: No upper extremity supported;Feet  supported Sitting balance-Leahy Scale: Good       Standing balance-Leahy Scale: Fair Standing balance comment: pt able to static stand without UE support                            Cognition Arousal/Alertness: Awake/alert Behavior During Therapy: WFL for tasks assessed/performed Overall Cognitive Status: Within Functional Limits for tasks assessed                                        Exercises Total Joint Exercises Quad Sets: AROM;Right;10 reps Short Arc Quad: AROM;Right;10 reps Heel Slides: AAROM;Right;10 reps Hip ABduction/ADduction: AROM;Right;10 reps Straight Leg Raises: AROM;Right;10 reps Long Arc Quad: AROM;Right;10 reps Goniometric ROM: 3-85    General Comments        Pertinent Vitals/Pain Pain Assessment: 0-10 Pain Score: 10-Worst pain ever Pain Location: R knee with mobility Pain Descriptors / Indicators: Aching;Grimacing;Guarding;Sore Pain Intervention(s): Monitored during session;Limited activity within patient's tolerance;Repositioned;Other (comment) (pt declined pain medication)    Home Living                      Prior Function            PT Goals (current goals can now be found in the care plan section) Progress towards PT goals: Progressing toward goals    Frequency    7X/week      PT Plan Current plan remains appropriate    Co-evaluation  End of Session Equipment Utilized During Treatment: Gait belt Activity Tolerance: Patient tolerated treatment well Patient left: in chair;with call bell/phone within reach;with family/visitor present Nurse Communication: Mobility status PT Visit Diagnosis: Other abnormalities of gait and mobility (R26.89);Pain;Other (comment) (Decreased ROM right Knee) Pain - Right/Left: Right Pain - part of body: Knee     Time: [id number] PT Time Calculation (min) (ACUTE ONLY): 29 min  Charges:  $Gait Training: 8-22 mins $Therapeutic Exercise: 8-22  mins                    G Codes:       Erline LevineKellyn Vanassa Penniman, PTA Pager: [phone]     Carolynne EdouardKellyn R Carl Butner 03/10/2017, 9:13 AM

## 2017-03-10 NOTE — Progress Notes (Signed)
   Subjective: 3 Days Post-Op Procedure(s) (LRB): RIGHT TOTAL KNEE ARTHROPLASTY (Right) LEFT KNEE INJECTION (Left)  c/o moderate pain in the right knee this morning Plan for SNF placement today Denies any new symptoms or issues Patient reports pain as moderate.  Objective:   VITALS:   Vitals:   03/09/17 2127 03/10/17 0649  BP: (!) 144/66 (!) 155/82  Pulse: 87 94  Resp: 16 16  Temp: 98.8 F (37.1 C) 98.4 F (36.9 C)    Right knee incision healing well nv intact distally No rashes or edema Dressing changed  LABS  Recent Labs  03/08/17 0600 03/09/17 0438 03/10/17 0300  HGB 12.7 12.4 12.8  HCT 38.3 36.9 39.0  WBC 12.7* 13.3* 11.1*  PLT 166 177 170     Recent Labs  03/08/17 0600  NA 138  K 4.2  BUN 10  CREATININE 0.86  GLUCOSE 148*     Assessment/Plan: 3 Days Post-Op Procedure(s) (LRB): RIGHT TOTAL KNEE ARTHROPLASTY (Right) LEFT KNEE INJECTION (Left) D/c to SNF today f/u in 2 weeks Continue PT/OT Pain management    Alphonsa OverallBrad Bertie Mcconathy, MPAS, PA-C  03/10/2017, 8:08 AM

## 2017-03-10 NOTE — Progress Notes (Signed)
Orthopedics Progress Note  Subjective: Patient doing better this afternoon and feels that she can go home.  Objective:  Vitals:   03/10/17 0649 03/10/17 1332  BP: (!) 155/82 122/77  Pulse: 94 87  Resp: 16 16  Temp: 98.4 F (36.9 C) 97.5 F (36.4 C)    General: Awake and alert  Musculoskeletal: Right knee dressing changed to Aquacel, no swelling and no erythema Neurovascularly intact  Lab Results  Component Value Date   WBC 11.1 (H) 03/10/2017   HGB 12.8 03/10/2017   HCT 39.0 03/10/2017   MCV 89.0 03/10/2017   PLT 170 03/10/2017       Component Value Date/Time   NA 138 03/08/2017 0600   K 4.2 03/08/2017 0600   CL 102 03/08/2017 0600   CO2 28 03/08/2017 0600   GLUCOSE 148 (H) 03/08/2017 0600   BUN 10 03/08/2017 0600   CREATININE 0.86 03/08/2017 0600   CALCIUM 9.1 03/08/2017 0600   GFRNONAA >60 03/08/2017 0600   GFRAA >60 03/08/2017 0600     Assessment/Plan: POD #3 s/p Procedure(s): RIGHT TOTAL KNEE ARTHROPLASTY LEFT KNEE INJECTION Patient to be discharged to home with outpatient therapy. Follow up in two weeks in the office  Viviann SpareSteven R. Ranell PatrickNorris, MD 03/10/2017 3:50 PM

## 2017-03-10 NOTE — Progress Notes (Signed)
Pt placed in CPM; pt and daughter both agree pt has to remain in CPM for 2hours prior discharge; per report this afternoon ortho-tech had attempted to put pt into the CPM x2 but unsuccessful. Dionne BucyP. Amo Rease Swinson RN

## 2017-03-10 NOTE — [place] FL2 (Signed)
[place] MEDICAID FL2 LEVEL OF CARE SCREENING TOOL     IDENTIFICATION  Patient Name: Robin PaceJoni Evans Birthdate: Oct 08, 1951 Sex: female Admission Date (Current Location): 03/07/2017  [facility] [place]   Facility and Address:  [facility], [address], [place], [place] [address]      Provider Number: [id number]  Attending Physician Name and Address:  Beverely LowSteve Norris, MD  Relative Name and Phone Number:       Current Level of Care: Hospital Recommended Level of Care: Skilled Nursing Facility Prior Approval Number:    Date Approved/Denied:   PASRR Number: [id number]  Discharge Plan: SNF    Current Diagnoses: Patient Active Problem List   Diagnosis Date Noted  . H/O total knee replacement, right 03/07/2017    Orientation RESPIRATION BLADDER Height & Weight     Self, Situation, Place, Time  Normal Continent Weight: 216 lb (98 kg) Height:  5\' 9"  (175.3 cm)  BEHAVIORAL SYMPTOMS/MOOD NEUROLOGICAL BOWEL NUTRITION STATUS      Continent  (Please see d/c summary)  AMBULATORY STATUS COMMUNICATION OF NEEDS Skin   Limited Assist Verbally Surgical wounds (Closed incision right knee compression wrapped. Closed incision left knee adhesive bandage.)                       Personal Care Assistance Level of Assistance  Bathing, Feeding, Dressing Bathing Assistance: Limited assistance Feeding assistance: Independent Dressing Assistance: Limited assistance     Functional Limitations Info  Hearing, Speech, Sight Sight Info: Adequate Hearing Info: Adequate      SPECIAL CARE FACTORS FREQUENCY  PT (By licensed PT), OT (By licensed OT)     PT Frequency: 7x OT Frequency: 7x            Contractures Contractures Info: Not present    Additional Factors Info  Code Status, Allergies Code Status Info: Full Code Allergies Info: Vancomycin           Current Medications (03/10/2017):  This is the current hospital active  medication list Current Facility-Administered Medications  Medication Dose Route Frequency Provider Last Rate Last Dose  . 0.9 %  sodium chloride infusion   Intravenous Continuous Beverely LowSteve Norris, MD 50 mL/hr at 03/07/17 1526    . acetaminophen (TYLENOL) tablet 650 mg  650 mg Oral Q6H PRN Beverely LowSteve Norris, MD   650 mg at 03/10/17 1116   Or  . acetaminophen (TYLENOL) suppository 650 mg  650 mg Rectal Q6H PRN Beverely LowSteve Norris, MD      . aspirin chewable tablet 81 mg  81 mg Oral BID Beverely LowSteve Norris, MD   81 mg at 03/10/17 [id number]  . bisacodyl (DULCOLAX) suppository 10 mg  10 mg Rectal Daily PRN Beverely LowSteve Norris, MD      . diphenhydrAMINE (BENADRYL) 12.5 MG/5ML elixir 12.5-25 mg  12.5-25 mg Oral Q6H PRN Colvin Carolihomas Bradley Dixon, PA-C   25 mg at 03/09/17 0513  . docusate sodium (COLACE) capsule 100 mg  100 mg Oral BID Beverely LowSteve Norris, MD   100 mg at 03/10/17 [id number]  . irbesartan (AVAPRO) tablet 150 mg  150 mg Oral Daily Beverely LowSteve Norris, MD   150 mg at 03/10/17 0939  . levothyroxine (SYNTHROID, LEVOTHROID) tablet 125 mcg  125 mcg Oral QAC breakfast Beverely LowSteve Norris, MD   125 mcg at 03/10/17 0825  . menthol-cetylpyridinium (CEPACOL) lozenge 3 mg  1 lozenge Oral PRN Beverely LowSteve Norris, MD       Or  . phenol [facility](CHLORASEPTIC) mouth  spray 1 spray  1 spray Mouth/Throat PRN Beverely Low, MD      . methocarbamol (ROBAXIN) tablet 500 mg  500 mg Oral Q6H PRN Beverely Low, MD   500 mg at 03/09/17 1610   Or  . methocarbamol (ROBAXIN) 500 mg in dextrose 5 % 50 mL IVPB  500 mg Intravenous Q6H PRN Beverely Low, MD      . metoCLOPramide (REGLAN) tablet 5-10 mg  5-10 mg Oral Q8H PRN Beverely Low, MD       Or  . metoCLOPramide (REGLAN) injection 5-10 mg  5-10 mg Intravenous Q8H PRN Beverely Low, MD      . morphine 4 MG/ML injection 3-5 mg  3-5 mg Intravenous Q2H PRN Beverely Low, MD      . ondansetron [facility]) tablet 4 mg  4 mg Oral Q6H PRN Beverely Low, MD       Or  . ondansetron [facility]) injection 4 mg  4 mg Intravenous Q6H PRN Beverely Low, MD      .  oxyCODONE (Oxy IR/ROXICODONE) immediate release tablet 5-10 mg  5-10 mg Oral Q3H PRN Beverely Low, MD   5 mg at 03/09/17 1501  . pantoprazole (PROTONIX) EC tablet 40 mg  40 mg Oral Daily Beverely Low, MD   40 mg at 03/10/17 9604  . polyethylene glycol (MIRALAX / GLYCOLAX) packet 17 g  17 g Oral Daily PRN Beverely Low, MD      . pravastatin (PRAVACHOL) tablet 40 mg  40 mg Oral QPM Beverely Low, MD   40 mg at 03/09/17 1741     Discharge Medications: Please see discharge summary for a list of discharge medications.  Relevant Imaging Results:  Relevant Lab Results:   Additional Information SSN: [id number]  Maree Krabbe, LCSW

## 2017-03-10 NOTE — Clinical Social Work Note (Signed)
At this time pt has decided to go [facility]. Clinical Social Worker will sign off for now as social work intervention is no longer needed. Please consult us again if new need arises.   Clarisse GougeBridget A Taraoluwa Thakur 03/10/2017

## 2017-03-11 NOTE — Care Management (Signed)
Dr. Aundria Rudogers contacted concerning patient being discharged to Home. She states she will have her daughters to assist with care and she is able to navigate with RW. Patient does not want to go to SNF.

## 2017-12-16 HISTORY — PX: ROTATOR CUFF REPAIR: SHX139

## 2019-02-08 IMAGING — DX DG KNEE 1-2V PORT*R*
2 series · 2 of 2 positions shown · non-contrast
Comparison: None.

CLINICAL DATA: Status post total knee replacement

EXAM:
PORTABLE RIGHT KNEE - 1-2 VIEW

[knee ap]
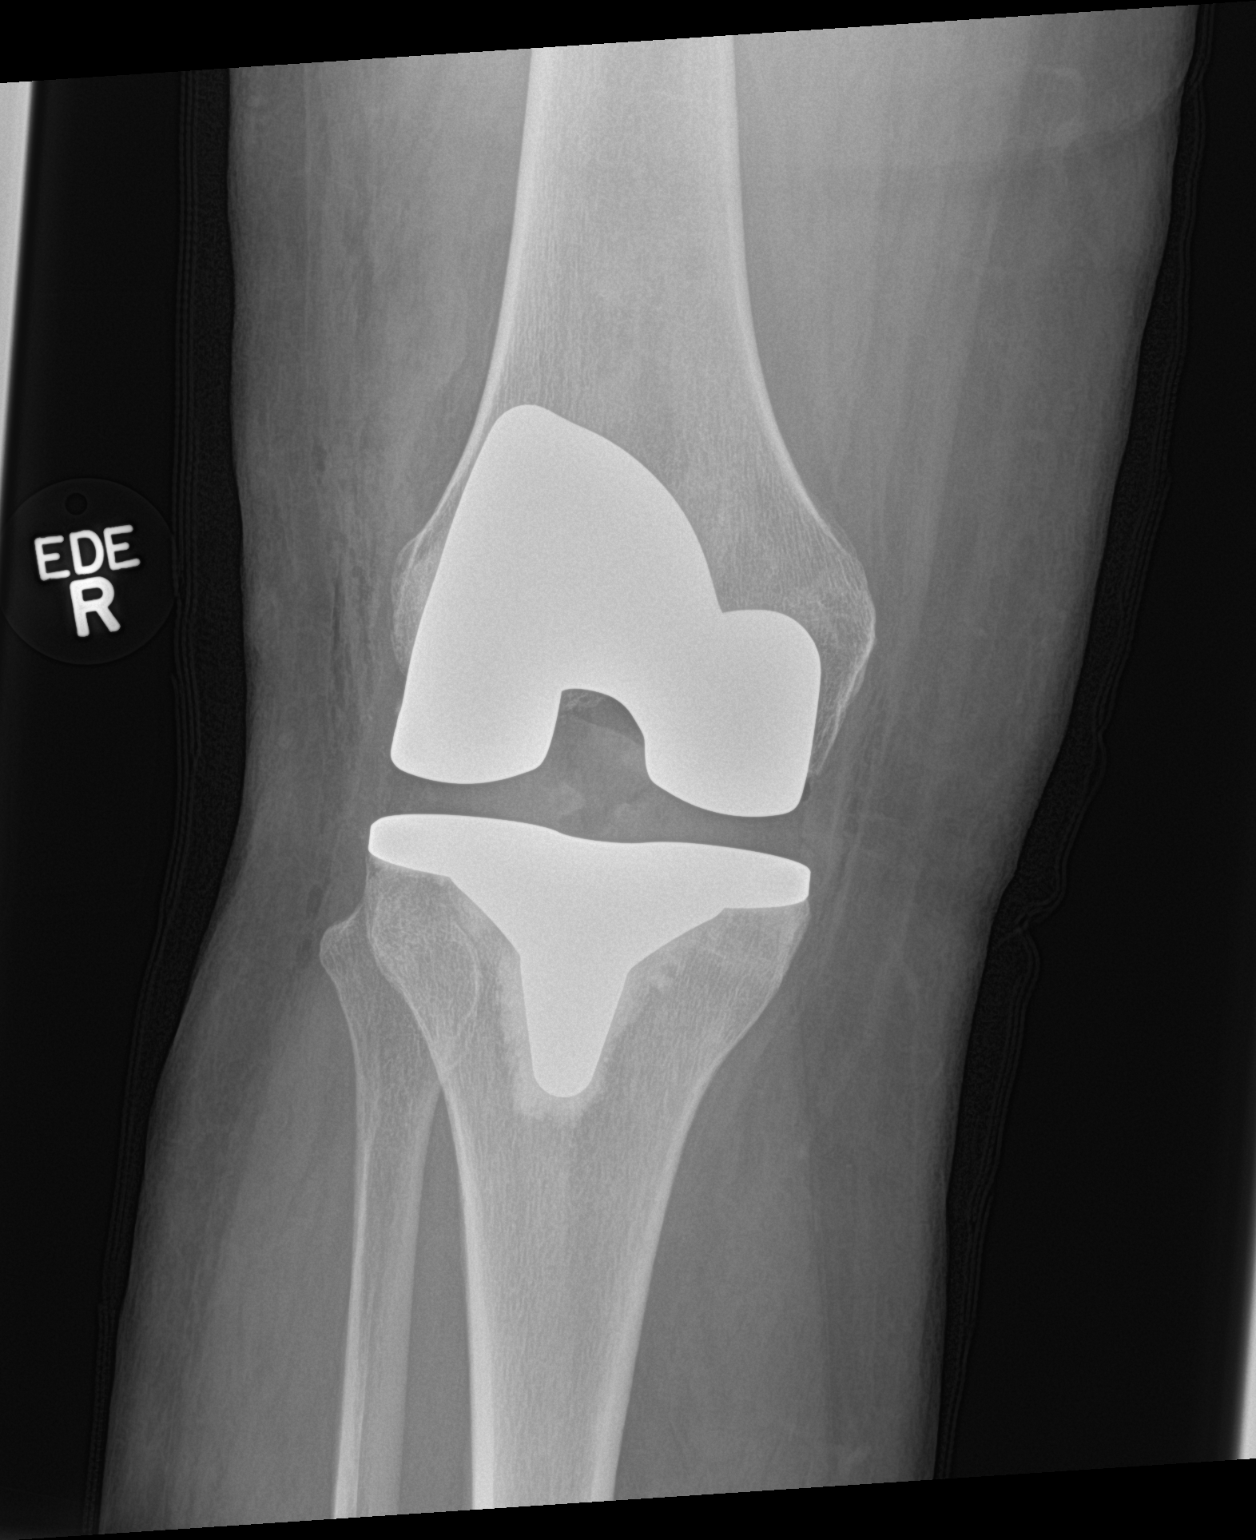

[knee lat]
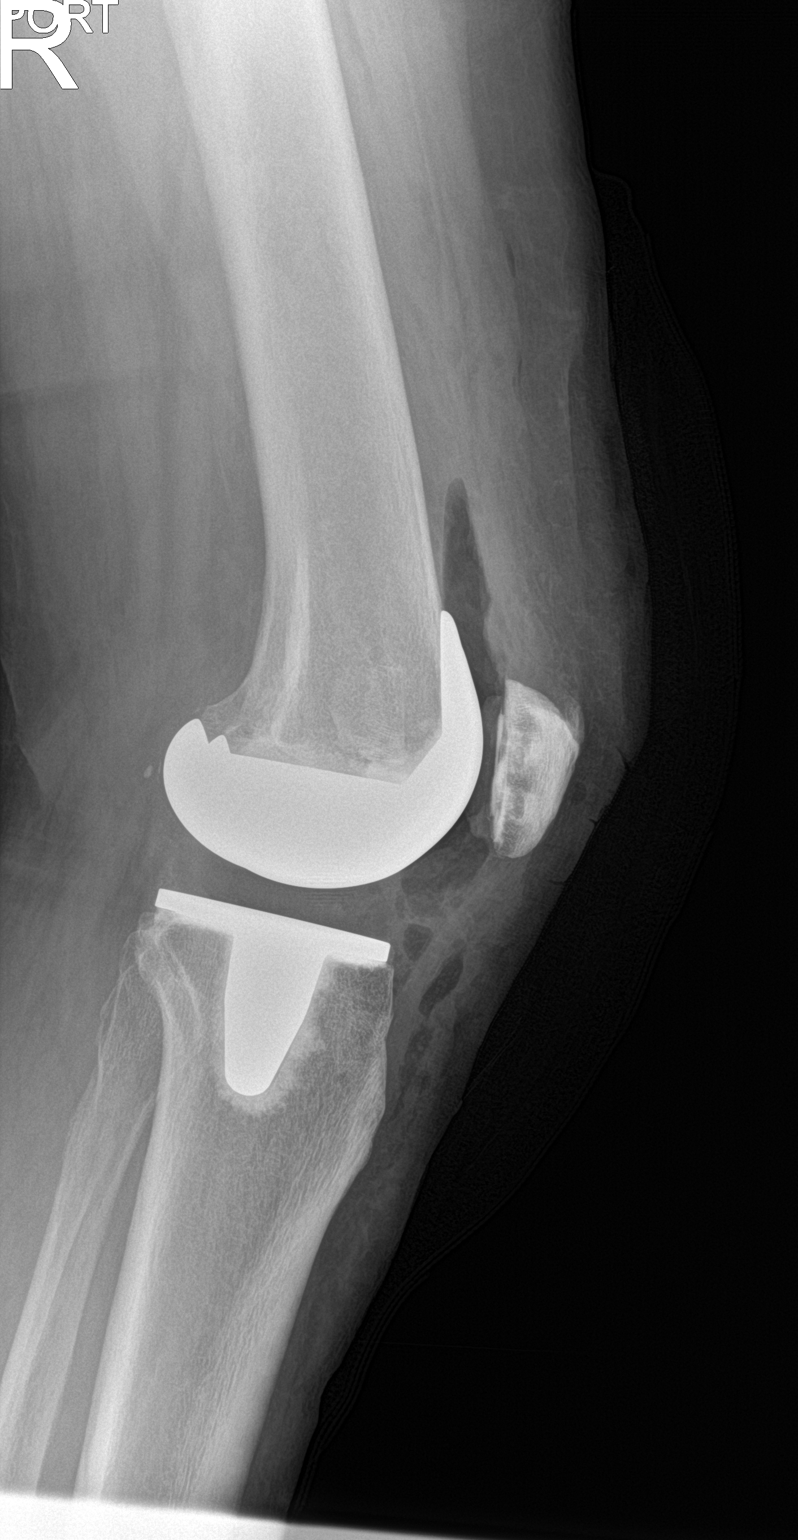

[2 of 2 positions shown; findings below may reference images not displayed]

FINDINGS: Frontal and lateral views were obtained. Patient is status post
total knee replacement with femoral and tibial prosthetic components
well-seated. No fracture or dislocation. Air is noted within the
knee joint, an expected postoperative finding. There is a spur along
the anterior superior patella.
IMPRESSION: Prosthetic components well-seated. No acute fracture or dislocation.
Probable distal quadriceps tendinosis with spurring along the
superior anterior patella.

## 2019-04-02 ENCOUNTER — Inpatient Hospital Stay: Admit: 2019-04-02 | Payer: Self-pay | Admitting: Orthopedic Surgery

## 2019-04-02 SURGERY — ARTHROPLASTY, KNEE, TOTAL
Anesthesia: Choice | Laterality: Left

## 2019-05-14 ENCOUNTER — Inpatient Hospital Stay: Admit: 2019-05-14 | Payer: Self-pay | Admitting: Orthopedic Surgery

## 2019-05-14 SURGERY — ARTHROPLASTY, KNEE, TOTAL
Anesthesia: Choice | Laterality: Left

## 2019-07-22 NOTE — H&P (Signed)
Patient's anticipated LOS is less than 2 midnights, meeting these requirements: - Younger than 31 - Lives within 1 hour of care - Has a competent adult at home to recover with post-op recover - NO history of  - Chronic pain requiring opiods  - Diabetes  - Coronary Artery Disease  - Heart failure  - Heart attack  - Stroke  - DVT/VTE  - Cardiac arrhythmia  - Respiratory Failure/COPD  - Renal failure  - Anemia  - Advanced Liver disease       Robin Evans is an 68 y.o. female.    Chief Complaint: left knee pain  HPI: Pt is a 68 y.o. female complaining of left knee pain for multiple years. Pain had continually increased since the beginning. X-rays in the clinic show end-stage arthritic changes of the left knee. Pt has tried various conservative treatments which have failed to alleviate their symptoms, including injections and therapy. Various options are discussed with the patient. Risks, benefits and expectations were discussed with the patient. Patient understand the risks, benefits and expectations and wishes to proceed with surgery.   PCP:  Verdell Carmine., MD  D/C Plans: Home  PMH: Past Medical History:  Diagnosis Date  . Arthritis   . Dysrhythmia    occ palpitations  no cardiac dr-  . GERD (gastroesophageal reflux disease)   . History of hiatal hernia   . Hypertension   . Hypothyroidism   . Sleep apnea    cpap    PSH: Past Surgical History:  Procedure Laterality Date  . BACK SURGERY  2005  . CARPAL TUNNEL RELEASE Right 90's  . CESAREAN SECTION     x2  . HAND FUSION Right 2004   fusion  . INJECTION KNEE Left 03/07/2017   Procedure: LEFT KNEE INJECTION;  Surgeon: Netta Cedars, MD;  Location: [facility];  Service: Orthopedics;  Laterality: Left;  . TOTAL KNEE ARTHROPLASTY Right 03/07/2017   Procedure: RIGHT TOTAL KNEE ARTHROPLASTY;  Surgeon: Netta Cedars, MD;  Location: [facility];  Service: Orthopedics;  Laterality: Right;    Social History:  reports that she quit  smoking about 31 years ago. She has a 22.00 pack-year smoking history. She has never used smokeless tobacco. She reports that she does not drink alcohol or use drugs.  Allergies:  Allergies  Allergen Reactions  . Vancomycin Hives and Itching    Medications: No current facility-administered medications for this encounter.    Current Outpatient Medications  Medication Sig Dispense Refill  . aspirin (ASPIRIN CHILDRENS) 81 MG chewable tablet Chew 1 tablet (81 mg total) by mouth 2 (two) times daily. 60 tablet 0  . levothyroxine (SYNTHROID, LEVOTHROID) 125 MCG tablet Take 125 mcg by mouth daily.  5  . methocarbamol (ROBAXIN) 500 MG tablet Take 1 tablet (500 mg total) by mouth 3 (three) times daily as needed. 60 tablet 1  . omeprazole (PRILOSEC) 20 MG capsule Take 20 mg by mouth daily as needed.    Marland Kitchen oxyCODONE-acetaminophen (ROXICET) 5-325 MG tablet Take 1-2 tablets by mouth every 4 (four) hours as needed for severe pain. 60 tablet 0  . pravastatin (PRAVACHOL) 40 MG tablet Take 40 mg by mouth every evening.  3  . valsartan (DIOVAN) 160 MG tablet Take 160 mg by mouth daily.  2    No results found for this or any previous visit (from the past 48 hour(s)). No results found.  ROS: Pain with rom of the left lower extremity  Physical Exam: Alert and oriented 68  y.o. female in no acute distress Cranial nerves 2-12 intact Cervical spine: full rom with no tenderness, nv intact distally Chest: active breath sounds bilaterally, no wheeze rhonchi or rales Heart: regular rate and rhythm, no murmur Abd: non tender non distended with active bowel sounds Hip is stable with rom  Left knee painful rom with crepitus nv intact distally Antalgic gait No rashes  Assessment/Plan Assessment: left knee end stage osteoarthritis  Plan:  Patient will undergo a left total knee by [facility]ris [facility]. Risks benefits and expectations were discussed with the patient. Patient understand risks, benefits  and expectations and wishes to proceed. Preoperative templating of the joint replacement has been completed, documented, and submitted to the Operating Room personnel in order to optimize intra-operative equipment management.   Alphonsa OverallBrad Maxmilian Trostel PA-C, MPAS [facility] now [facility]  [place] [address], [place], [place] [address] Phone: [phone] [email or web address] Facebook  [employer]  LinkedIn  Twitter

## 2019-07-22 NOTE — Patient Instructions (Addendum)
YOU NEED TO HAVE A COVID 19 TEST ON____Tuesday, August 11th___ @__  3:00pm or earlier _____, THIS TEST MUST BE DONE BEFORE SURGERY, COME  [place], [place] , [address]. ONCE YOUR COVID TEST IS COMPLETED, PLEASE BEGIN THE QUARANTINE INSTRUCTIONS AS OUTLINED IN YOUR HANDOUT.               Robin Evans    Your procedure is scheduled on: 07-30-2019   Report to [facility] Main  Entrance    Report to [facility] at 5:30AM   1 VISITOR IS ALLOWED TO WAIT IN WAITING ROOM  ONLY DAY OF YOUR SURGERY.    Call this number if you have problems the morning of surgery [place] AND RINSE YOUR MOUTH OUT, NO CHEWING GUM CANDY OR MINTS.   Remember: Do not eat food After Midnight. YOU MAY HAVE CLEAR LIQUIDS FROM MIDNIGHT UNTIL 4:30AM. At 4:30AM Please finish the prescribed Pre-Surgery ENSURE drink. Nothing by mouth after you finish the ENSURE drink !   CLEAR LIQUID DIET   Foods Allowed                                                                     Foods Excluded  Coffee and tea, regular and decaf                             liquids that you cannot  Plain Jell-O any favor except red or purple                                           see through such as: Fruit ices (not with fruit pulp)                                     milk, soups, orange juice  Iced Popsicles                                    All solid food Carbonated beverages, regular and diet                                    Cranberry, grape and apple juices Sports drinks like Gatorade Lightly seasoned clear broth or consume(fat free) Sugar, honey syrup  Sample Menu Breakfast                                Lunch                                     Supper Cranberry juice                    Beef broth  Chicken broth Jell-O                                     Grape juice                           Apple juice Coffee or tea                         Jell-O                                      Popsicle                                                Coffee or tea                        Coffee or tea  _____________________________________________________________________    Take these medicines the morning of surgery with A SIP OF WATER: Levothyroxine, Omeprazole if needed, Oxycodone if needed                                You may not have any metal on your body including hair pins and              piercings  Do not wear jewelry, make-up, lotions, powders or perfumes, deodorant             Do not wear nail polish.  Do not shave  48 hours prior to surgery.     Do not bring valuables to the hospital. [facility] IS NOT             RESPONSIBLE   FOR VALUABLES.  Contacts, dentures or bridgework may not be worn into surgery.                Please read over the following fact sheets you were given: _____________________________________________________________________             [facility] - Preparing for [facility] Before surgery, you can play an important role.  Because skin is not sterile, your skin needs to be as free of germs as possible.  You can reduce the number of germs on your skin by washing with CHG (chlorahexidine gluconate) soap before surgery.  CHG is an antiseptic cleaner which kills germs and bonds with the skin to continue killing germs even after washing. Please DO NOT use if you have an allergy to CHG or antibacterial soaps.  If your skin becomes reddened/irritated stop using the CHG and inform your nurse when you arrive at Short Stay. Do not shave (including legs and underarms) for at least 48 hours prior to the first CHG shower.  You may shave your face/neck. Please follow these instructions carefully:  1.  Shower with CHG Soap the night before surgery and the  morning of Surgery.  2.  If you choose to wash your hair, wash your hair first as usual with your  normal  shampoo.  3.  After you shampoo, rinse your hair and  body thoroughly to  remove the  shampoo.                           4.  Use CHG as you would any other liquid soap.  You can apply chg directly  to the skin and wash                       Gently with a scrungie or clean washcloth.  5.  Apply the CHG Soap to your body ONLY FROM THE NECK DOWN.   Do not use on face/ open                           Wound or open sores. Avoid contact with eyes, ears mouth and genitals (private parts).                       Wash face,  Genitals (private parts) with your normal soap.             6.  Wash thoroughly, paying special attention to the area where your surgery  will be performed.  7.  Thoroughly rinse your body with warm water from the neck down.  8.  DO NOT shower/wash with your normal soap after using and rinsing off  the CHG Soap.                9.  Pat yourself dry with a clean towel.            10.  Wear clean pajamas.            11.  Place clean sheets on your bed the night of your first shower and do not  sleep with pets. Day of Surgery : Do not apply any lotions/deodorants the morning of surgery.  Please wear clean clothes to the hospital/surgery center.  FAILURE TO FOLLOW THESE INSTRUCTIONS MAY RESULT IN THE CANCELLATION OF YOUR SURGERY PATIENT SIGNATURE_________________________________  NURSE SIGNATURE__________________________________  ________________________________________________________________________   Robin MireIncentive Spirometer  An incentive spirometer is a tool that can help keep your lungs clear and active. This tool measures how well you are filling your lungs with each breath. Taking long deep breaths may help reverse or decrease the chance of developing breathing (pulmonary) problems (especially infection) following:  A long period of time when you are unable to move or be active. BEFORE THE PROCEDURE   If the spirometer includes an indicator to show your best effort, your nurse or respiratory therapist will set it to a desired  goal.  If possible, sit up straight or lean slightly forward. Try not to slouch.  Hold the incentive spirometer in an upright position. INSTRUCTIONS FOR USE  1. Sit on the edge of your bed if possible, or sit up as far as you can in bed or on a chair. 2. Hold the incentive spirometer in an upright position. 3. Breathe out normally. 4. Place the mouthpiece in your mouth and seal your lips tightly around it. 5. Breathe in slowly and as deeply as possible, raising the piston or the ball toward the top of the column. 6. Hold your breath for 3-5 seconds or for as long as possible. Allow the piston or ball to fall to the bottom of the column. 7. Remove the mouthpiece from your mouth and breathe out normally. 8. Rest for a few seconds and repeat Steps 1 through 7 at least  10 times every 1-2 hours when you are awake. Take your time and take a few normal breaths between deep breaths. 9. The spirometer may include an indicator to show your best effort. Use the indicator as a goal to work toward during each repetition. 10. After each set of 10 deep breaths, practice coughing to be sure your lungs are clear. If you have an incision (the cut made at the time of surgery), support your incision when coughing by placing a pillow or rolled up towels firmly against it. Once you are able to get out of bed, walk around indoors and cough well. You may stop using the incentive spirometer when instructed by your caregiver.  RISKS AND COMPLICATIONS  Take your time so you do not get dizzy or light-headed.  If you are in pain, you may need to take or ask for pain medication before doing incentive spirometry. It is harder to take a deep breath if you are having pain. AFTER USE  Rest and breathe slowly and easily.  It can be helpful to keep track of a log of your progress. Your caregiver can provide you with a simple table to help with this. If you are using the spirometer at home, follow these instructions: SEEK  MEDICAL CARE IF:   You are having difficultly using the spirometer.  You have trouble using the spirometer as often as instructed.  Your pain medication is not giving enough relief while using the spirometer.  You develop fever of 100.5 F (38.1 C) or higher. SEEK IMMEDIATE MEDICAL CARE IF:   You cough up bloody sputum that had not been present before.  You develop fever of 102 F (38.9 C) or greater.  You develop worsening pain at or near the incision site. MAKE SURE YOU:   Understand these instructions.  Will watch your condition.  Will get help right away if you are not doing well or get worse. Document Released: 04/14/2007 Document Revised: 02/24/2012 Document Reviewed: 06/15/2007 [facility] Patient Information 2014 [employer] [place] [employer] [place].   ________________________________________________________________________

## 2019-07-23 ENCOUNTER — Encounter (HOSPITAL_COMMUNITY)
Admission: RE | Admit: 2019-07-23 | Discharge: 2019-07-23 | Disposition: A | Discharge status: Home / Self Care | Payer: No Typology Code available for payment source | Source: Ambulatory Visit | Attending: Orthopedic Surgery | Admitting: Orthopedic Surgery

## 2019-07-23 ENCOUNTER — Encounter (HOSPITAL_COMMUNITY): Payer: Self-pay

## 2019-07-23 ENCOUNTER — Other Ambulatory Visit: Payer: Self-pay

## 2019-07-23 DIAGNOSIS — M1712 Unilateral primary osteoarthritis, left knee: Secondary | ICD-10-CM | POA: Insufficient documentation

## 2019-07-23 DIAGNOSIS — Z01818 Encounter for other preprocedural examination: Secondary | ICD-10-CM | POA: Insufficient documentation

## 2019-07-23 DIAGNOSIS — I10 Essential (primary) hypertension: Secondary | ICD-10-CM | POA: Insufficient documentation

## 2019-07-23 HISTORY — DX: Unspecified urinary incontinence: R32

## 2019-07-23 HISTORY — DX: Unspecified open wound, left lower leg, initial encounter: S81.802A

## 2019-07-23 HISTORY — DX: Laceration without foreign body of left upper arm, initial encounter: S41.112A

## 2019-07-23 HISTORY — DX: Cardiac murmur, unspecified: R01.1

## 2019-07-23 HISTORY — DX: Palpitations: R00.2

## 2019-07-23 LAB — BASIC METABOLIC PANEL
Anion gap: 9 (ref 5–15)
BUN: 18 mg/dL (ref 8–23)
CO2: 31 mmol/L (ref 22–32)
Calcium: 9.3 mg/dL (ref 8.9–10.3)
Chloride: 101 mmol/L (ref 98–111)
Creatinine, Ser: 1.2 mg/dL — ABNORMAL HIGH (ref 0.44–1.00)
GFR calc Af Amer: 54 mL/min — ABNORMAL LOW (ref >60)
GFR calc non Af Amer: 47 mL/min — ABNORMAL LOW (ref >60)
Glucose, Bld: 99 mg/dL (ref 70–99)
Potassium: 3.8 mmol/L (ref 3.5–5.1)
Sodium: 141 mmol/L (ref 135–145)

## 2019-07-23 LAB — CBC
HCT: 40.5 % (ref 36.0–46.0)
Hemoglobin: 13 g/dL (ref 12.0–15.0)
MCH: 29.9 pg (ref 26.0–34.0)
MCHC: 32.1 g/dL (ref 30.0–36.0)
MCV: 93.1 fL (ref 80.0–100.0)
Platelets: 194 10*3/uL (ref 150–400)
RBC: 4.35 MIL/uL (ref 3.87–5.11)
RDW: 13.9 % (ref 11.5–15.5)
WBC: 6.1 10*3/uL (ref 4.0–10.5)
nRBC: 0 % (ref 0.0–0.2)

## 2019-07-23 LAB — SURGICAL PCR SCREEN
MRSA, PCR: NEGATIVE
Staphylococcus aureus: NEGATIVE

## 2019-07-27 ENCOUNTER — Other Ambulatory Visit (HOSPITAL_COMMUNITY)
Admission: RE | Admit: 2019-07-27 | Discharge: 2019-07-27 | Disposition: A | Discharge status: Home / Self Care | Payer: No Typology Code available for payment source | Source: Ambulatory Visit | Attending: Orthopedic Surgery | Admitting: Orthopedic Surgery

## 2019-07-27 DIAGNOSIS — Z01818 Encounter for other preprocedural examination: Secondary | ICD-10-CM | POA: Diagnosis not present

## 2019-07-27 DIAGNOSIS — Z20828 Contact with and (suspected) exposure to other viral communicable diseases: Secondary | ICD-10-CM | POA: Insufficient documentation

## 2019-07-27 DIAGNOSIS — Z01812 Encounter for preprocedural laboratory examination: Secondary | ICD-10-CM | POA: Insufficient documentation

## 2019-07-27 LAB — SARS CORONAVIRUS 2 (TAT 6-24 HRS): SARS Coronavirus 2: NEGATIVE

## 2019-07-30 ENCOUNTER — Observation Stay (HOSPITAL_COMMUNITY): Payer: Self-pay

## 2019-07-30 ENCOUNTER — Observation Stay (HOSPITAL_COMMUNITY): Payer: No Typology Code available for payment source

## 2019-07-30 ENCOUNTER — Inpatient Hospital Stay (HOSPITAL_COMMUNITY): Payer: No Typology Code available for payment source | Admitting: Registered Nurse

## 2019-07-30 ENCOUNTER — Encounter (HOSPITAL_COMMUNITY)
Admission: RE | Disposition: A | Discharge status: Home Health | Payer: Self-pay | Source: Ambulatory Visit | Attending: Orthopedic Surgery

## 2019-07-30 ENCOUNTER — Other Ambulatory Visit: Payer: Self-pay

## 2019-07-30 ENCOUNTER — Inpatient Hospital Stay (HOSPITAL_COMMUNITY)
Admission: RE | Admit: 2019-07-30 | Discharge: 2019-08-01 | DRG: 470 | Disposition: A | Discharge status: Home Health | Payer: No Typology Code available for payment source | Source: Ambulatory Visit | Attending: Orthopedic Surgery | Admitting: Orthopedic Surgery

## 2019-07-30 ENCOUNTER — Encounter (HOSPITAL_COMMUNITY): Payer: Self-pay | Admitting: *Deleted

## 2019-07-30 DIAGNOSIS — Z96652 Presence of left artificial knee joint: Secondary | ICD-10-CM

## 2019-07-30 DIAGNOSIS — Z881 Allergy status to other antibiotic agents status: Secondary | ICD-10-CM | POA: Diagnosis not present

## 2019-07-30 DIAGNOSIS — K219 Gastro-esophageal reflux disease without esophagitis: Secondary | ICD-10-CM | POA: Diagnosis present

## 2019-07-30 DIAGNOSIS — K449 Diaphragmatic hernia without obstruction or gangrene: Secondary | ICD-10-CM | POA: Diagnosis present

## 2019-07-30 DIAGNOSIS — Z87891 Personal history of nicotine dependence: Secondary | ICD-10-CM

## 2019-07-30 DIAGNOSIS — G4730 Sleep apnea, unspecified: Secondary | ICD-10-CM | POA: Diagnosis present

## 2019-07-30 DIAGNOSIS — M1712 Unilateral primary osteoarthritis, left knee: Principal | ICD-10-CM | POA: Diagnosis present

## 2019-07-30 DIAGNOSIS — Z7982 Long term (current) use of aspirin: Secondary | ICD-10-CM

## 2019-07-30 DIAGNOSIS — M25562 Pain in left knee: Secondary | ICD-10-CM | POA: Diagnosis present

## 2019-07-30 DIAGNOSIS — I10 Essential (primary) hypertension: Secondary | ICD-10-CM | POA: Diagnosis present

## 2019-07-30 DIAGNOSIS — E039 Hypothyroidism, unspecified: Secondary | ICD-10-CM | POA: Diagnosis present

## 2019-07-30 DIAGNOSIS — Z96651 Presence of right artificial knee joint: Secondary | ICD-10-CM

## 2019-07-30 DIAGNOSIS — Z79890 Hormone replacement therapy (postmenopausal): Secondary | ICD-10-CM | POA: Diagnosis not present

## 2019-07-30 HISTORY — PX: TOTAL KNEE ARTHROPLASTY: SHX125

## 2019-07-30 SURGERY — ARTHROPLASTY, KNEE, TOTAL
Anesthesia: Spinal | Site: Knee | Laterality: Left

## 2019-07-30 MED ORDER — EPHEDRINE 5 MG/ML INJ
INTRAVENOUS | Status: AC
  Filled 2019-07-30: qty 10

## 2019-07-30 MED ORDER — ROPIVACAINE HCL 5 MG/ML IJ SOLN
INTRAMUSCULAR | Status: DC | PRN
  Administered 2019-07-30: 20 mL via PERINEURAL

## 2019-07-30 MED ORDER — ONDANSETRON HCL 4 MG PO TABS: 4.0000 mg | ORAL_TABLET | Freq: Four times a day (QID) | ORAL | Status: DC | PRN

## 2019-07-30 MED ORDER — OXYCODONE-ACETAMINOPHEN 5-325 MG PO TABS: 1.0000 | ORAL_TABLET | ORAL | 0 refills | Status: AC | PRN

## 2019-07-30 MED ORDER — PHENOL 1.4 % MT LIQD
1.0000 | OROMUCOSAL | Status: DC | PRN
  Filled 2019-07-30: qty 177

## 2019-07-30 MED ORDER — HYDROMORPHONE HCL 1 MG/ML IJ SOLN
0.5000 mg | INTRAMUSCULAR | Status: DC | PRN
  Administered 2019-07-30: 11:00:00 1 mg via INTRAVENOUS
  Filled 2019-07-30: qty 1

## 2019-07-30 MED ORDER — TRANEXAMIC ACID-NACL 1000-0.7 MG/100ML-% IV SOLN
1000.0000 mg | Freq: Once | INTRAVENOUS | Status: AC
  Administered 2019-07-30: 1000 mg via INTRAVENOUS
  Filled 2019-07-30: qty 100

## 2019-07-30 MED ORDER — 0.9 % SODIUM CHLORIDE (POUR BTL) OPTIME
TOPICAL | Status: DC | PRN
  Administered 2019-07-30: 07:00:00 1000 mL

## 2019-07-30 MED ORDER — ONDANSETRON HCL 4 MG/2ML IJ SOLN: 4.0000 mg | Freq: Four times a day (QID) | INTRAMUSCULAR | Status: DC | PRN

## 2019-07-30 MED ORDER — ASPIRIN 81 MG PO CHEW
81.0000 mg | CHEWABLE_TABLET | Freq: Two times a day (BID) | ORAL | Status: DC
  Administered 2019-07-30 – 2019-08-01 (×4): 81 mg via ORAL
  Filled 2019-07-30 (×4): qty 1

## 2019-07-30 MED ORDER — PANTOPRAZOLE SODIUM 40 MG PO TBEC
40.0000 mg | DELAYED_RELEASE_TABLET | Freq: Every day | ORAL | Status: DC
  Administered 2019-07-30 – 2019-08-01 (×3): 40 mg via ORAL
  Filled 2019-07-30 (×3): qty 1

## 2019-07-30 MED ORDER — METOCLOPRAMIDE HCL 5 MG PO TABS: 5.0000 mg | ORAL_TABLET | Freq: Three times a day (TID) | ORAL | Status: DC | PRN

## 2019-07-30 MED ORDER — ACETAMINOPHEN 325 MG PO TABS
325.0000 mg | ORAL_TABLET | Freq: Four times a day (QID) | ORAL | Status: DC | PRN
  Administered 2019-07-31: 650 mg via ORAL
  Filled 2019-07-30: qty 2

## 2019-07-30 MED ORDER — BISACODYL 10 MG RE SUPP: 10.0000 mg | Freq: Every day | RECTAL | Status: DC | PRN

## 2019-07-30 MED ORDER — CEFAZOLIN SODIUM-DEXTROSE 2-4 GM/100ML-% IV SOLN
2.0000 g | Freq: Four times a day (QID) | INTRAVENOUS | Status: AC
  Administered 2019-07-30 (×2): 2 g via INTRAVENOUS
  Filled 2019-07-30 (×2): qty 100

## 2019-07-30 MED ORDER — MIDAZOLAM HCL 2 MG/2ML IJ SOLN
INTRAMUSCULAR | Status: AC
  Filled 2019-07-30: qty 2

## 2019-07-30 MED ORDER — FENTANYL CITRATE (PF) 100 MCG/2ML IJ SOLN
INTRAMUSCULAR | Status: AC
  Filled 2019-07-30: qty 2

## 2019-07-30 MED ORDER — PRAVASTATIN SODIUM 20 MG PO TABS
40.0000 mg | ORAL_TABLET | Freq: Every evening | ORAL | Status: DC
  Administered 2019-07-30 – 2019-07-31 (×2): 40 mg via ORAL
  Filled 2019-07-30 (×2): qty 2

## 2019-07-30 MED ORDER — ACETAMINOPHEN ER 650 MG PO TBCR: 650.0000 mg | EXTENDED_RELEASE_TABLET | Freq: Three times a day (TID) | ORAL | Status: DC | PRN

## 2019-07-30 MED ORDER — METHOCARBAMOL 500 MG IVPB - SIMPLE MED
500.0000 mg | Freq: Four times a day (QID) | INTRAVENOUS | Status: DC | PRN
  Administered 2019-07-30: 500 mg via INTRAVENOUS
  Filled 2019-07-30: qty 50
  Filled 2019-07-30: qty 500

## 2019-07-30 MED ORDER — ONDANSETRON HCL 4 MG/2ML IJ SOLN
INTRAMUSCULAR | Status: AC
  Filled 2019-07-30: qty 2

## 2019-07-30 MED ORDER — EPHEDRINE SULFATE-NACL 50-0.9 MG/10ML-% IV SOSY
PREFILLED_SYRINGE | INTRAVENOUS | Status: DC | PRN
  Administered 2019-07-30: 15 mg via INTRAVENOUS

## 2019-07-30 MED ORDER — LIDOCAINE 2% (20 MG/ML) 5 ML SYRINGE
INTRAMUSCULAR | Status: AC
  Filled 2019-07-30: qty 5

## 2019-07-30 MED ORDER — BUPIVACAINE IN DEXTROSE 0.75-8.25 % IT SOLN
INTRATHECAL | Status: DC | PRN
  Administered 2019-07-30: 1.8 mL via INTRATHECAL

## 2019-07-30 MED ORDER — FERROUS SULFATE 325 (65 FE) MG PO TABS
325.0000 mg | ORAL_TABLET | Freq: Three times a day (TID) | ORAL | Status: DC
  Administered 2019-07-30 – 2019-08-01 (×6): 325 mg via ORAL
  Filled 2019-07-30 (×6): qty 1

## 2019-07-30 MED ORDER — MENTHOL 3 MG MT LOZG: 1.0000 | LOZENGE | OROMUCOSAL | Status: DC | PRN

## 2019-07-30 MED ORDER — METOCLOPRAMIDE HCL 5 MG/ML IJ SOLN: 5.0000 mg | Freq: Three times a day (TID) | INTRAMUSCULAR | Status: DC | PRN

## 2019-07-30 MED ORDER — LIDOCAINE 2% (20 MG/ML) 5 ML SYRINGE
INTRAMUSCULAR | Status: DC | PRN
  Administered 2019-07-30: 40 mg via INTRAVENOUS

## 2019-07-30 MED ORDER — CEFAZOLIN SODIUM-DEXTROSE 2-4 GM/100ML-% IV SOLN
2.0000 g | INTRAVENOUS | Status: AC
  Administered 2019-07-30: 2 g via INTRAVENOUS
  Filled 2019-07-30: qty 100

## 2019-07-30 MED ORDER — DOCUSATE SODIUM 100 MG PO CAPS
100.0000 mg | ORAL_CAPSULE | Freq: Two times a day (BID) | ORAL | Status: DC
  Administered 2019-07-30 – 2019-08-01 (×4): 100 mg via ORAL
  Filled 2019-07-30 (×4): qty 1

## 2019-07-30 MED ORDER — DULOXETINE HCL 30 MG PO CPEP
30.0000 mg | ORAL_CAPSULE | Freq: Every day | ORAL | Status: DC
  Administered 2019-07-30 – 2019-07-31 (×2): 30 mg via ORAL
  Filled 2019-07-30 (×2): qty 1

## 2019-07-30 MED ORDER — LACTATED RINGERS IV SOLN
INTRAVENOUS | Status: DC | PRN
  Administered 2019-07-30 (×2): via INTRAVENOUS

## 2019-07-30 MED ORDER — OXYCODONE HCL 5 MG/5ML PO SOLN: 5.0000 mg | Freq: Once | ORAL | Status: DC | PRN

## 2019-07-30 MED ORDER — ASPIRIN 81 MG PO CHEW: 81.0000 mg | CHEWABLE_TABLET | Freq: Two times a day (BID) | ORAL | 0 refills | Status: AC

## 2019-07-30 MED ORDER — POLYETHYLENE GLYCOL 3350 17 G PO PACK: 17.0000 g | PACK | Freq: Every day | ORAL | Status: DC | PRN

## 2019-07-30 MED ORDER — DEXAMETHASONE SODIUM PHOSPHATE 10 MG/ML IJ SOLN
INTRAMUSCULAR | Status: DC | PRN
  Administered 2019-07-30: 10 mg via INTRAVENOUS

## 2019-07-30 MED ORDER — DEXAMETHASONE SODIUM PHOSPHATE 10 MG/ML IJ SOLN
INTRAMUSCULAR | Status: AC
  Filled 2019-07-30: qty 1

## 2019-07-30 MED ORDER — CHLORHEXIDINE GLUCONATE 4 % EX LIQD: 60.0000 mL | Freq: Once | CUTANEOUS | Status: DC

## 2019-07-30 MED ORDER — ONDANSETRON HCL 4 MG/2ML IJ SOLN
INTRAMUSCULAR | Status: DC | PRN
  Administered 2019-07-30: 4 mg via INTRAVENOUS

## 2019-07-30 MED ORDER — LOSARTAN POTASSIUM 50 MG PO TABS
100.0000 mg | ORAL_TABLET | Freq: Every day | ORAL | Status: DC
  Administered 2019-07-30 – 2019-08-01 (×3): 100 mg via ORAL
  Filled 2019-07-30 (×3): qty 2

## 2019-07-30 MED ORDER — FENTANYL CITRATE (PF) 100 MCG/2ML IJ SOLN
INTRAMUSCULAR | Status: DC | PRN
  Administered 2019-07-30 (×2): 50 ug via INTRAVENOUS

## 2019-07-30 MED ORDER — SODIUM CHLORIDE 0.9 % IV SOLN
INTRAVENOUS | Status: DC
  Administered 2019-07-30: 16:00:00 via INTRAVENOUS

## 2019-07-30 MED ORDER — TRANEXAMIC ACID-NACL 1000-0.7 MG/100ML-% IV SOLN
1000.0000 mg | INTRAVENOUS | Status: AC
  Administered 2019-07-30: 1000 mg via INTRAVENOUS
  Filled 2019-07-30: qty 100

## 2019-07-30 MED ORDER — POVIDONE-IODINE 10 % EX SWAB: 2.0000 "application " | Freq: Once | CUTANEOUS | Status: DC

## 2019-07-30 MED ORDER — WATER FOR IRRIGATION, STERILE IR SOLN
Status: DC | PRN
  Administered 2019-07-30 (×2): 1000 mL

## 2019-07-30 MED ORDER — MIDAZOLAM HCL 5 MG/5ML IJ SOLN
INTRAMUSCULAR | Status: DC | PRN
  Administered 2019-07-30 (×2): 1 mg via INTRAVENOUS

## 2019-07-30 MED ORDER — METHOCARBAMOL 500 MG PO TABS: 500.0000 mg | ORAL_TABLET | Freq: Four times a day (QID) | ORAL | 1 refills | Status: DC | PRN

## 2019-07-30 MED ORDER — OXYCODONE HCL 5 MG PO TABS: 5.0000 mg | ORAL_TABLET | Freq: Once | ORAL | Status: DC | PRN

## 2019-07-30 MED ORDER — LEVOTHYROXINE SODIUM 125 MCG PO TABS
125.0000 ug | ORAL_TABLET | Freq: Every day | ORAL | Status: DC
  Administered 2019-07-31 – 2019-08-01 (×2): 125 ug via ORAL
  Filled 2019-07-30 (×2): qty 1

## 2019-07-30 MED ORDER — HYDROCHLOROTHIAZIDE 25 MG PO TABS
12.5000 mg | ORAL_TABLET | Freq: Every day | ORAL | Status: DC
  Administered 2019-07-30 – 2019-08-01 (×3): 12.5 mg via ORAL
  Filled 2019-07-30 (×3): qty 1

## 2019-07-30 MED ORDER — SODIUM CHLORIDE 0.9 % IR SOLN
Status: DC | PRN
  Administered 2019-07-30: 3000 mL

## 2019-07-30 MED ORDER — FENTANYL CITRATE (PF) 100 MCG/2ML IJ SOLN: 25.0000 ug | INTRAMUSCULAR | Status: DC | PRN

## 2019-07-30 MED ORDER — OXYCODONE HCL 5 MG PO TABS
5.0000 mg | ORAL_TABLET | ORAL | Status: DC | PRN
  Administered 2019-07-30 (×2): 10 mg via ORAL
  Administered 2019-07-31: 5 mg via ORAL
  Administered 2019-07-31 (×3): 10 mg via ORAL
  Administered 2019-07-31: 5 mg via ORAL
  Administered 2019-08-01: 08:00:00 10 mg via ORAL
  Administered 2019-08-01: 5 mg via ORAL
  Filled 2019-07-30: qty 2
  Filled 2019-07-30: qty 1
  Filled 2019-07-30: qty 2
  Filled 2019-07-30: qty 1
  Filled 2019-07-30 (×5): qty 2

## 2019-07-30 MED ORDER — METHOCARBAMOL 500 MG PO TABS
500.0000 mg | ORAL_TABLET | Freq: Four times a day (QID) | ORAL | Status: DC | PRN
  Administered 2019-07-31 – 2019-08-01 (×4): 500 mg via ORAL
  Filled 2019-07-30 (×4): qty 1

## 2019-07-30 MED ORDER — PROPOFOL 500 MG/50ML IV EMUL
INTRAVENOUS | Status: DC | PRN
  Administered 2019-07-30: 50 ug/kg/min via INTRAVENOUS

## 2019-07-30 SURGICAL SUPPLY — 55 items
BAG ZIPLOCK 12X15 (MISCELLANEOUS) ×3 IMPLANT
BLADE SAG 18X100X1.27 (BLADE) ×3 IMPLANT
BLADE SAW SGTL 13X75X1.27 (BLADE) ×3 IMPLANT
BNDG ELASTIC 6X10 VLCR STRL LF (GAUZE/BANDAGES/DRESSINGS) ×3 IMPLANT
BNDG GAUZE ELAST 4 BULKY (GAUZE/BANDAGES/DRESSINGS) ×3 IMPLANT
BOWL SMART MIX CTS (DISPOSABLE) ×3 IMPLANT
CEMENT HV SMART SET (Cement) ×6 IMPLANT
CEMENT TIBIA MBT (Knees) IMPLANT
CLOSURE STERI-STRIP 1/2X4 (GAUZE/BANDAGES/DRESSINGS) ×1
CLOSURE WOUND 1/2 X4 (GAUZE/BANDAGES/DRESSINGS) ×2
CLSR STERI-STRIP ANTIMIC 1/2X4 (GAUZE/BANDAGES/DRESSINGS) ×1 IMPLANT
COVER SURGICAL LIGHT HANDLE (MISCELLANEOUS) ×3 IMPLANT
COVER WAND RF STERILE (DRAPES) IMPLANT
CUFF TOURN SGL QUICK 34 (TOURNIQUET CUFF) ×2
CUFF TOURN SGL QUICK 42 (TOURNIQUET CUFF) ×1 IMPLANT
CUFF TRNQT CYL 34X4.125X (TOURNIQUET CUFF) ×1 IMPLANT
DRAPE SHEET LG 3/4 BI-LAMINATE (DRAPES) ×3 IMPLANT
DRAPE U-SHAPE 47X51 STRL (DRAPES) ×3 IMPLANT
DRSG ADAPTIC 3X8 NADH LF (GAUZE/BANDAGES/DRESSINGS) ×3 IMPLANT
DRSG PAD ABDOMINAL 8X10 ST (GAUZE/BANDAGES/DRESSINGS) ×3 IMPLANT
DURAPREP 26ML APPLICATOR (WOUND CARE) ×3 IMPLANT
ELECT REM PT RETURN 15FT ADLT (MISCELLANEOUS) ×3 IMPLANT
FEMUR SIGMA PS KNEE SZ 4.0N L (Femur) ×2 IMPLANT
GAUZE SPONGE 4X4 12PLY STRL (GAUZE/BANDAGES/DRESSINGS) ×3 IMPLANT
GLOVE BIOGEL PI ORTHO PRO 7.5 (GLOVE) ×4
GLOVE BIOGEL PI ORTHO PRO SZ8 (GLOVE) ×2
GLOVE ORTHO TXT STRL SZ7.5 (GLOVE) ×3 IMPLANT
GLOVE PI ORTHO PRO STRL 7.5 (GLOVE) ×1 IMPLANT
GLOVE PI ORTHO PRO STRL SZ8 (GLOVE) ×1 IMPLANT
GLOVE SURG ORTHO 8.5 STRL (GLOVE) ×6 IMPLANT
GOWN STRL REIN XL XLG (GOWN DISPOSABLE) ×6 IMPLANT
HANDPIECE INTERPULSE COAX TIP (DISPOSABLE) ×2
HOLDER FOLEY CATH W/STRAP (MISCELLANEOUS) IMPLANT
IMMOBILIZER KNEE 20 (SOFTGOODS) ×3
IMMOBILIZER KNEE 20 THIGH 36 (SOFTGOODS) IMPLANT
KIT TURNOVER KIT A (KITS) IMPLANT
MANIFOLD NEPTUNE II (INSTRUMENTS) ×3 IMPLANT
NS IRRIG 1000ML POUR BTL (IV SOLUTION) ×3 IMPLANT
PACK TOTAL KNEE CUSTOM (KITS) ×3 IMPLANT
PATELLA DOME PFC 35MM (Knees) ×2 IMPLANT
PIN STEINMAN FIXATION KNEE (PIN) ×2 IMPLANT
PLATE ROT INSERT 15MM SIZE 4 (Plate) ×2 IMPLANT
PROTECTOR NERVE ULNAR (MISCELLANEOUS) ×3 IMPLANT
SET HNDPC FAN SPRY TIP SCT (DISPOSABLE) ×1 IMPLANT
STAPLER VISISTAT 35W (STAPLE) IMPLANT
STRIP CLOSURE SKIN 1/2X4 (GAUZE/BANDAGES/DRESSINGS) ×4 IMPLANT
SUT MNCRL AB 3-0 PS2 18 (SUTURE) ×3 IMPLANT
SUT VIC AB 0 CT1 36 (SUTURE) ×3 IMPLANT
SUT VIC AB 1 CT1 36 (SUTURE) ×9 IMPLANT
SUT VIC AB 2-0 CT1 27 (SUTURE) ×2
SUT VIC AB 2-0 CT1 TAPERPNT 27 (SUTURE) ×1 IMPLANT
TIBIA MBT CEMENT (Knees) ×3 IMPLANT
TRAY FOLEY MTR SLVR 16FR STAT (SET/KITS/TRAYS/PACK) ×3 IMPLANT
WATER STERILE IRR 1000ML POUR (IV SOLUTION) ×6 IMPLANT
YANKAUER SUCT BULB TIP 10FT TU (MISCELLANEOUS) ×3 IMPLANT

## 2019-07-30 NOTE — Transfer of Care (Signed)
Immediate Anesthesia Transfer of Care Note  Patient: Robin Evans  Procedure(s) Performed: TOTAL KNEE ARTHROPLASTY (Left Knee)  Patient Location: PACU  Anesthesia Type:Spinal  Level of Consciousness: awake, alert  and oriented  Airway & Oxygen Therapy: Patient Spontanous Breathing and Patient connected to face mask oxygen  Post-op Assessment: Report given to RN and Post -op Vital signs reviewed and stable  Post vital signs: Reviewed and stable  Last Vitals:  Vitals Value Taken Time  BP 143/84 07/30/19 0935  Temp    Pulse 70 07/30/19 0936  Resp 12 07/30/19 0936  SpO2 100 % 07/30/19 0936  Vitals shown include unvalidated device data.  Last Pain:  Vitals:   07/30/19 0548  TempSrc: Oral  PainSc: 0-No pain      Patients Stated Pain Goal: 3 (10/93/23 5573)  Complications: No apparent anesthesia complications

## 2019-07-30 NOTE — Anesthesia Procedure Notes (Signed)
Date/Time: 07/30/2019 7:43 AM Performed by: Talbot Grumbling, CRNA Oxygen Delivery Method: Simple face mask

## 2019-07-30 NOTE — Anesthesia Procedure Notes (Signed)
Spinal  Patient location during procedure: OR Start time: 07/30/2019 7:40 AM End time: 07/30/2019 7:48 AM Staffing Anesthesiologist: Albertha Ghee, MD Performed: anesthesiologist  Preanesthetic Checklist Completed: patient identified, surgical consent, pre-op evaluation, timeout performed, IV checked, risks and benefits discussed and monitors and equipment checked Spinal Block Patient position: sitting Prep: DuraPrep Patient monitoring: cardiac monitor, continuous pulse ox and blood pressure Approach: midline Location: L3-4 Injection technique: single-shot Needle Needle type: Pencan  Needle gauge: 24 G Needle length: 9 cm Assessment Sensory level: T10 Additional Notes Functioning IV was confirmed and monitors were applied. Sterile prep and drape, including hand hygiene and sterile gloves were used. The patient was positioned and the spine was prepped. The skin was anesthetized with lidocaine.  Free flow of clear CSF was obtained prior to injecting local anesthetic into the CSF.  The spinal needle aspirated freely following injection.  The needle was carefully withdrawn.  The patient tolerated the procedure well.

## 2019-07-30 NOTE — Anesthesia Procedure Notes (Signed)
Anesthesia Regional Block: Adductor canal block   Pre-Anesthetic Checklist: ,, timeout performed, Correct Patient, Correct Site, Correct Laterality, Correct Procedure, Correct Position, site marked, Risks and benefits discussed,  Surgical consent,  Pre-op evaluation,  At surgeon's request and post-op pain management  Laterality: Left  Prep: chloraprep       Needles:  Injection technique: Single-shot  Needle Type: Echogenic Needle     Needle Length: 9cm  Needle Gauge: 21     Additional Needles:   Narrative:  Start time: 07/30/2019 7:02 AM End time: 07/30/2019 7:09 AM Injection made incrementally with aspirations every 5 mL.  Performed by: Personally  Anesthesiologist: Albertha Ghee, MD  Additional Notes: Pt tolerated the procedure well.

## 2019-07-30 NOTE — Discharge Instructions (Signed)
Ice to the knee constantly.  Keep the incision covered and clean and dry for one week, then ok to get it wet in the shower.  Do exercise as instructed several times per day. Prop under the ankle and NOT the knee to maintain extension.  Wear the knee immobilizer with the knee fully straight at night to maintain extension.  You are ok to place your full weight on the left leg  Make sure the wear the support stockings 24/7 for one month to prevent blood clots  Follow up with Dr Veverly Fells in two weeks in the office, call [phone] for appt

## 2019-07-30 NOTE — Brief Op Note (Signed)
07/30/2019  9:35 AM  PATIENT:  Robin Evans  68 y.o. female  PRE-OPERATIVE DIAGNOSIS:  Left knee osteoarthritis, end stage  POST-OPERATIVE DIAGNOSIS:  Left knee osteoarthritis, end stage  PROCEDURE:  Procedure(s): TOTAL KNEE ARTHROPLASTY (Left) DePuy Sigma RP  SURGEON:  Surgeon(s) and Role:    Netta Cedars, MD - Primary  PHYSICIAN ASSISTANT:   ASSISTANTS: Ventura Bruns, PA-C   ANESTHESIA:  Adductor canal block and spinal block  EBL:  50 mL   BLOOD ADMINISTERED:none  DRAINS: none   LOCAL MEDICATIONS USED:  NONE  SPECIMEN:  No Specimen  DISPOSITION OF SPECIMEN:  n/a  COUNTS:  YES  TOURNIQUET:   Total Tourniquet Time Documented: Thigh (Left) - 85 minutes Total: Thigh (Left) - 85 minutes   DICTATION: .Other Dictation: Dictation Number [id number]  PLAN OF CARE: Admit to inpatient   PATIENT DISPOSITION:  PACU - hemodynamically stable.   Delay start of Pharmacological VTE agent (>24hrs) due to surgical blood loss or risk of bleeding: no

## 2019-07-30 NOTE — Anesthesia Preprocedure Evaluation (Signed)
Anesthesia Evaluation  Patient identified by MRN, date of birth, ID band Patient awake    Reviewed: Allergy & Precautions, H&P , NPO status , Patient's Chart, lab work & pertinent test results  Airway Mallampati: II   Neck ROM: full    Dental   Pulmonary sleep apnea , former smoker,    breath sounds clear to auscultation       Cardiovascular hypertension,  Rhythm:regular Rate:Normal     Neuro/Psych    GI/Hepatic hiatal hernia, GERD  ,  Endo/Other  Hypothyroidism   Renal/GU      Musculoskeletal  (+) Arthritis ,   Abdominal   Peds  Hematology   Anesthesia Other Findings   Reproductive/Obstetrics                             Anesthesia Physical Anesthesia Plan  ASA: II  Anesthesia Plan: Spinal   Post-op Pain Management:  Regional for Post-op pain   Induction: Intravenous  PONV Risk Score and Plan: 2 and Propofol infusion, Ondansetron, Midazolam and Treatment may vary due to age or medical condition  Airway Management Planned: Simple Face Mask  Additional Equipment:   Intra-op Plan:   Post-operative Plan:   Informed Consent: I have reviewed the patients History and Physical, chart, labs and discussed the procedure including the risks, benefits and alternatives for the proposed anesthesia with the patient or authorized representative who has indicated his/her understanding and acceptance.       Plan Discussed with: CRNA, Anesthesiologist and Surgeon  Anesthesia Plan Comments:         Anesthesia Quick Evaluation

## 2019-07-30 NOTE — Op Note (Signed)
NAMPatrecia Pace: Dicola, Robin Evans MEDICAL RECORD [id number] ACCOUNT [id number] DATE OF BIRTH:June 02, 1951 FACILITY: [facility] LOCATION: [facility] PHYSICIAN:STEVEN Russ Halo. Alixander Rallis, MD  OPERATIVE REPORT  DATE OF PROCEDURE:  07/30/2019  PREOPERATIVE DIAGNOSIS:  Left knee end-stage osteoarthritis.  POSTOPERATIVE DIAGNOSIS:  Left knee end-stage osteoarthritis.  PROCEDURE PERFORMED:  Left total knee replacement using DePuy Sigma rotating platform of the prosthesis.  ATTENDING SURGEON:  Beverely LowSteve Lycan Davee, MD  ASSISTANT:  Modesto Charonhomas Brad Dixon, [place], who was scrubbed during the entire procedure and necessary for satisfactory completion of surgery.  ANESTHESIA:  Spinal anesthesia was used plus adductor canal block.  ESTIMATED BLOOD LOSS:  Minimal.  FLUID REPLACEMENT:  1500 mL crystalloid.  INSTRUMENT COUNTS:  Correct.  COMPLICATIONS:  No complications.  ANTIBIOTICS:  Perioperative antibiotics were given.  TOURNIQUET TIME:  1 hour and 20 minutes at 300 mmHg.  INDICATIONS:  The patient is a 68 year old female with worsening left knee pain secondary to bone-on-bone end-stage arthritis.  The patient has had progressive pain despite conservative management and desires operative treatment to restore function and  eliminate pain to the knee.  Informed consent was obtained.  DESCRIPTION OF PROCEDURE:  After an adequate level of anesthesia was achieved, the patient was positioned in the supine position.  Left leg correctly identified and a nonsterile tourniquet placed on proximal thigh.  Left leg was sterilely prepped and  draped in the usual manner.  Time-out called verifying correct patient, correct site.  We elevated the leg and exsanguinated with an Esmarch bandage, elevating the tourniquet to 300 mmHg.  We then placed the knee in flexion and performed a longitudinal  midline incision.  With the knee in flexion, dissection down through subcutaneous tissues using the knife.  We used a fresh 10-blade scalpel to perform a  median parapatellar arthrotomy.  We divided lateral patellofemoral ligaments, everted the patella  identifying bone-on-bone wear with eburnated bone in the patellofemoral joint as well as advanced wear in the distal medial femoral condyle in the distal portion weightbearing surface.  We then went ahead and entered the distal femur with a step-cut  drill.  We placed our intramedullary resection guide, resecting 10 mm off at 5 degrees valgus cut.  We then sized the femur to a size 4 anterior down.  This would be a 4 narrow.  We then performed our anterior, posterior and chamfer cuts with a 4-in-1  block, released ACL, PCL, meniscal tissues, subluxing the tibia anteriorly with a McHale retractor.  We then used an external alignment jig to resect the tibia 90 degrees perpendicular to the long axis of the tibia with minimal posterior slope, resecting  2-3 mm of bone off the medial affected side.  Once we had our tibial cut done, we sized the tibia to a size 3.  We also checked our gaps, which were symmetric at 10 mm.  We released the excess osteophytes off the posterior femoral condyle and did our  final cleanout with the knee in flexion.  Once we completed our tibial preparation with a modular drill and keel punch and the trial tibia was in place, a size 3, we went to the femur and did our box cut and then placed our trial 4 left narrow femur and  impacted that in position.  We reduced initially with a size 10 and went to a 12.5 poly trial which was stable and had good alignment.  We then went ahead and resurfaced our patella.  This was quite a bit of wear on the backside of the patella  just to  get to a flat cut.  We had to go to a 13 mm cut.  Once we had that cut with the patellar cutting guide, we drilled our lug holes for the 35 patellar button.  With the trial patellar button in place, we ranged the knee and had excellent tracking with  no-touch technique.  We removed all trial components, pulse irrigated  the bone, vacuum mixed DePuy high viscosity cement on the back table, and then cemented the components into place, tibia, femur with the 12.5 insert and then the patellar button and  held the patellar button with a patellar clamp until the cement was hardened.  We also had a knee extension for decompression of the implants.  We removed excess cement with a 1/4-inch curved osteotome and a Soil scientist.  Once the cement was set and  all excess cement removed with final inspection of the posterior aspect of the knee, we actually selected the size 4, 15 poly because we felt that the knee was slightly loose in flexion, and we could get the 15 in place.  With the 15 implant in, we had  excellent stability, full extension, and we were pleased with that flexion.  We irrigated thoroughly with a pulse irrigator with normal saline irrigation and then closed the parapatellar arthrotomy with #1 Vicryl suture followed by 2-0 Vicryl for  subcutaneous closure and 4-0 Monocryl for skin.  Steri-Strips applied followed by sterile dressing.  The patient tolerated surgery well.  LN/NUANCE  D:07/30/2019 T:07/30/2019 JOB:[id number]

## 2019-07-30 NOTE — Anesthesia Postprocedure Evaluation (Signed)
Anesthesia Post Note  Patient: Robin Evans  Procedure(s) Performed: TOTAL KNEE ARTHROPLASTY (Left Knee)     Patient location during evaluation: PACU Anesthesia Type: Spinal Level of consciousness: oriented and awake and alert Pain management: pain level controlled Vital Signs Assessment: post-procedure vital signs reviewed and stable Respiratory status: spontaneous breathing, respiratory function stable and patient connected to nasal cannula oxygen Cardiovascular status: blood pressure returned to baseline and stable Postop Assessment: no headache, no backache and no apparent nausea or vomiting Anesthetic complications: no    Last Vitals:  Vitals:   07/30/19 1210 07/30/19 1344  BP: 118/81 132/85  Pulse: 71 75  Resp: 16   Temp: 36.4 C 36.7 C  SpO2: 97% 99%    Last Pain:  Vitals:   07/30/19 1210  TempSrc: Oral  PainSc:                  Latexo S

## 2019-07-30 NOTE — Interval H&P Note (Signed)
History and Physical Interval Note:  07/30/2019 7:31 AM  Robin Evans  has presented today for surgery, with the diagnosis of Left knee osteoarthritis.  The various methods of treatment have been discussed with the patient and family. After consideration of risks, benefits and other options for treatment, the patient has consented to  Procedure(s): TOTAL KNEE ARTHROPLASTY (Left) as a surgical intervention.  The patient's history has been reviewed, patient examined, no change in status, stable for surgery.  I have reviewed the patient's chart and labs.  Questions were answered to the patient's satisfaction.     Augustin Schooling

## 2019-07-30 NOTE — Evaluation (Signed)
Physical Therapy Evaluation Patient Details Name: Robin PaceJoni Reidel MRN: [id number] DOB: [birth year] Today's Date: 07/30/2019   History of Present Illness  Pt s/p L TKR and with hx of R TKR and back surgery  Clinical Impression  Pt s/p L TKR and presents with decreased L LE strength/ROM and post op pain limiting functional mobility.  Pt should progress to dc home with assist of family    Follow Up Recommendations Follow surgeon's recommendation for DC plan and follow-up therapies    Equipment Recommendations  None recommended by PT    Recommendations for Other Services       Precautions / Restrictions Precautions Precautions: Knee Required Braces or Orthoses: Knee Immobilizer - Left Knee Immobilizer - Left: Discontinue once straight leg raise with < 10 degree lag Restrictions Weight Bearing Restrictions: No Other Position/Activity Restrictions: WBAT      Mobility  Bed Mobility Overal bed mobility: Needs Assistance Bed Mobility: Supine to Sit     Supine to sit: Min assist     General bed mobility comments: cues for sequence and physical assist to manage L LE  Transfers Overall transfer level: Needs assistance Equipment used: Rolling walker (2 wheeled) Transfers: Sit to/from Stand Sit to Stand: Min assist         General transfer comment: cues for LE management and use of UEs to self assist  Ambulation/Gait Ambulation/Gait assistance: Min assist Gait Distance (Feet): 38 Feet Assistive device: Rolling walker (2 wheeled) Gait Pattern/deviations: Step-to pattern;Decreased step length - right;Decreased step length - left;Shuffle;Trunk flexed Gait velocity: decr   General Gait Details: cues for sequence, posture and position from [employer]  Stairs            Wheelchair Mobility    Modified Rankin (Stroke Patients Only)       Balance Overall balance assessment: Needs assistance Sitting-balance support: No upper extremity supported;Feet supported Sitting  balance-Leahy Scale: Good     Standing balance support: Bilateral upper extremity supported Standing balance-Leahy Scale: Poor                               Pertinent Vitals/Pain Pain Assessment: 0-10 Pain Score: 6  Pain Location: L knee with ambulation Pain Descriptors / Indicators: Aching;Sore Pain Intervention(s): Limited activity within patient's tolerance;Monitored during session;Premedicated before session;Ice applied    Home Living Family/patient expects to be discharged to:: Private residence Living Arrangements: Alone Available Help at Discharge: Family;Available 24 hours/day Type of Home: House Home Access: Level entry     Home Layout: One level Home Equipment: Walker - 2 wheels;Bedside commode      Prior Function Level of Independence: Independent               Hand Dominance   Dominant Hand: Right    Extremity/Trunk Assessment   Upper Extremity Assessment Upper Extremity Assessment: Overall WFL for tasks assessed    Lower Extremity Assessment Lower Extremity Assessment: LLE deficits/detail    Cervical / Trunk Assessment Cervical / Trunk Assessment: Normal  Communication   Communication: No difficulties  Cognition Arousal/Alertness: Awake/alert Behavior During Therapy: WFL for tasks assessed/performed Overall Cognitive Status: Within Functional Limits for tasks assessed                                        General Comments      Exercises Total Joint Exercises Ankle  Circles/Pumps: AROM;Both;15 reps;Supine   Assessment/Plan    PT Assessment Patient needs continued PT services  PT Problem List Decreased strength;Decreased range of motion;Decreased activity tolerance;Decreased balance;Decreased mobility;Decreased knowledge of use of DME;Pain       PT Treatment Interventions DME instruction;Gait training;Stair training;Functional mobility training;Therapeutic activities;Therapeutic exercise;Patient/family  education    PT Goals (Current goals can be found in the Care Plan section)  Acute Rehab PT Goals Patient Stated Goal: Regain IND PT Goal Formulation: With patient Time For Goal Achievement: 08/06/19 Potential to Achieve Goals: Good    Frequency 7X/week   Barriers to discharge        Co-evaluation               AM-PAC PT "6 Clicks" Mobility  Outcome Measure Help needed turning from your back to your side while in a flat bed without using bedrails?: A Little Help needed moving from lying on your back to sitting on the side of a flat bed without using bedrails?: A Little Help needed moving to and from a bed to a chair (including a wheelchair)?: A Little Help needed standing up from a chair using your arms (e.g., wheelchair or bedside chair)?: A Little Help needed to walk in hospital room?: A Little Help needed climbing 3-5 steps with a railing? : A Lot 6 Click Score: 17    End of Session Equipment Utilized During Treatment: Gait belt;Left knee immobilizer Activity Tolerance: Patient tolerated treatment well;Patient limited by pain Patient left: in chair;with call bell/phone within reach;with chair alarm set Nurse Communication: Mobility status PT Visit Diagnosis: Difficulty in walking, not elsewhere classified (R26.2)    Time: 3007-6226 PT Time Calculation (min) (ACUTE ONLY): 25 min   Charges:   PT Evaluation $PT Eval Low Complexity: 1 Low PT Treatments $Gait Training: 8-22 mins        [place] Pager [phone] Office [phone]   Keoni Havey 07/30/2019, 4:28 PM

## 2019-07-31 LAB — BASIC METABOLIC PANEL
Anion gap: 11 (ref 5–15)
BUN: 13 mg/dL (ref 8–23)
CO2: 25 mmol/L (ref 22–32)
Calcium: 8.7 mg/dL — ABNORMAL LOW (ref 8.9–10.3)
Chloride: 100 mmol/L (ref 98–111)
Creatinine, Ser: 0.82 mg/dL (ref 0.44–1.00)
GFR calc Af Amer: >60 mL/min (ref >60)
GFR calc non Af Amer: >60 mL/min (ref >60)
Glucose, Bld: 131 mg/dL — ABNORMAL HIGH (ref 70–99)
Potassium: 3.8 mmol/L (ref 3.5–5.1)
Sodium: 136 mmol/L (ref 135–145)

## 2019-07-31 LAB — CBC
HCT: 41.6 % (ref 36.0–46.0)
Hemoglobin: 13.3 g/dL (ref 12.0–15.0)
MCH: 30 pg (ref 26.0–34.0)
MCHC: 32 g/dL (ref 30.0–36.0)
MCV: 93.7 fL (ref 80.0–100.0)
Platelets: 182 10*3/uL (ref 150–400)
RBC: 4.44 MIL/uL (ref 3.87–5.11)
RDW: 13.7 % (ref 11.5–15.5)
WBC: 11.1 10*3/uL — ABNORMAL HIGH (ref 4.0–10.5)
nRBC: 0 % (ref 0.0–0.2)

## 2019-07-31 MED ORDER — ALUM & MAG HYDROXIDE-SIMETH 200-200-20 MG/5ML PO SUSP
15.0000 mL | ORAL | Status: DC | PRN
  Administered 2019-07-31: 15 mL via ORAL
  Filled 2019-07-31: qty 30

## 2019-07-31 NOTE — Progress Notes (Signed)
   Subjective:  Patient reports pain as mild to moderate.  No complaints, no CP/SOB/n/v.  Objective:   VITALS:   Vitals:   07/30/19 2205 07/31/19 0130 07/31/19 0523 07/31/19 0905  BP: 130/73 (!) 153/79 134/78 130/71  Pulse: 76 74 71 63  Resp: 16 16 16 16   Temp: 98.3 F (36.8 C) 98.2 F (36.8 C) 98.4 F (36.9 C) 98 F (36.7 C)  TempSrc: Oral Oral Oral Oral  SpO2: 97% 95% 95% 94%  Weight:      Height:        Neurologically intact Neurovascular intact Sensation intact distally Intact pulses distally Dorsiflexion/Plantar flexion intact Incision: dressing C/D/I   Lab Results  Component Value Date   WBC 11.1 (H) 07/31/2019   HGB 13.3 07/31/2019   HCT 41.6 07/31/2019   MCV 93.7 07/31/2019   PLT 182 07/31/2019   BMET    Component Value Date/Time   NA 136 07/31/2019 0241   K 3.8 07/31/2019 0241   CL 100 07/31/2019 0241   CO2 25 07/31/2019 0241   GLUCOSE 131 (H) 07/31/2019 0241   BUN 13 07/31/2019 0241   CREATININE 0.82 07/31/2019 0241   CALCIUM 8.7 (L) 07/31/2019 0241   GFRNONAA >60 07/31/2019 0241   GFRAA >60 07/31/2019 0241     Assessment/Plan: 1 Day Post-Op   Active Problems:   Status post total knee replacement, left   Advance diet Up with therapy - SCDs and asa for DVT  ppx - WBAT  - will need dressing changed to Aquacel before dc home. - DIspo: dc home tomorrow with HHPT    Nicholes Stairs 07/31/2019, 9:51 AM   Geralynn Rile, MD [phone]

## 2019-07-31 NOTE — TOC Progression Note (Signed)
Transition of Care [facility]) - Progression Note    Patient Details  Name: Hazeline Charnley MRN: [id number] Date of Birth: [birth year]  Transition of [facility]) CM/SW Contact  Joaquin Courts, RN Phone Number: 07/31/2019, 11:18 AM  Clinical Narrative:   CM spoke with patient at bedside. Patient [facility] up with [facility] for [place].  Patient reports she has rolling walker and 3-in-1 at home.     Expected Discharge Plan: [place] Barriers to Discharge: Continued Medical Work up  Expected Discharge Plan and Services Expected Discharge Plan: [place]   Discharge Planning Services: CM Consult Post Acute Care Choice: [place] arrangements for the past 2 months: Single Family Home Expected Discharge Date: 08/01/19               DME Arranged: N/A DME Agency: NA       HH Arranged: PT [place]: [facility] [employer] Date [place]: 07/31/19 Time [place]: 1118 Representative spoke with at [place]: [place] ([place]) Interventions    Readmission Risk Interventions No flowsheet data found.

## 2019-07-31 NOTE — Progress Notes (Signed)
Physical Therapy Treatment Patient Details Name: Robin Evans MRN: [id number] DOB: [birth year] Today's Date: 07/31/2019    History of Present Illness Pt s/p L TKR and with hx of R TKR and back surgery    PT Comments    Pt ambulated 130' with RW, no loss of balance. Instructed pt in TKA HEP, she demonstrates good understanding. From PT standpoint, she is ready to DC home.    Follow Up Recommendations  Follow surgeon's recommendation for DC plan and follow-up therapies     Equipment Recommendations  None recommended by PT    Recommendations for Other Services       Precautions / Restrictions Precautions Precautions: Knee Required Braces or Orthoses: Knee Immobilizer - Left Knee Immobilizer - Left: Discontinue once straight leg raise with < 10 degree lag Restrictions Weight Bearing Restrictions: No Other Position/Activity Restrictions: WBAT    Mobility  Bed Mobility Overal bed mobility: Modified Independent Bed Mobility: Supine to Sit     Supine to sit: Modified independent (Device/Increase time);HOB elevated     General bed mobility comments: HOB up, used rail  Transfers Overall transfer level: Needs assistance Equipment used: Rolling walker (2 wheeled) Transfers: Sit to/from Stand Sit to Stand: Supervision         General transfer comment: VCs hand placement  Ambulation/Gait Ambulation/Gait assistance: Supervision Gait Distance (Feet): 130 Feet Assistive device: Rolling walker (2 wheeled) Gait Pattern/deviations: Step-through pattern;Decreased stride length Gait velocity: decr   General Gait Details: good sequencing, no loss of balance   Stairs Stairs: (pt stated she has no stairs)           Wheelchair Mobility    Modified Rankin (Stroke Patients Only)       Balance Overall balance assessment: Needs assistance Sitting-balance support: No upper extremity supported;Feet supported Sitting balance-Leahy Scale: Good     Standing balance  support: Bilateral upper extremity supported Standing balance-Leahy Scale: Fair                              Cognition Arousal/Alertness: Awake/alert Behavior During Therapy: WFL for tasks assessed/performed Overall Cognitive Status: Within Functional Limits for tasks assessed                                        Exercises Total Joint Exercises Ankle Circles/Pumps: AROM;Both;15 reps;Supine Quad Sets: AROM;Left;5 reps;Supine Short Arc Quad: AAROM;Left;10 reps;Supine Heel Slides: AAROM;Left;10 reps;Supine Hip ABduction/ADduction: AROM;Left;10 reps;Supine Straight Leg Raises: AAROM;Left;10 reps;Supine Long Arc Quad: AROM;AAROM;Left;10 reps;Seated Knee Flexion: AAROM;AROM;Left;10 reps;Seated Goniometric ROM: 5-65* AAROM L knee    General Comments        Pertinent Vitals/Pain Pain Score: 3  Pain Location: L knee Pain Descriptors / Indicators: Sore Pain Intervention(s): Limited activity within patient's tolerance;Monitored during session;Premedicated before session;Ice applied    Home Living                      Prior Function            PT Goals (current goals can now be found in the care plan section) Acute Rehab PT Goals Patient Stated Goal: Regain IND, walk without pain PT Goal Formulation: With patient Time For Goal Achievement: 08/06/19 Potential to Achieve Goals: Good Progress towards PT goals: Progressing toward goals    Frequency    7X/week      PT Plan  Current plan remains appropriate    Co-evaluation              AM-PAC PT "6 Clicks" Mobility   Outcome Measure  Help needed turning from your back to your side while in a flat bed without using bedrails?: None Help needed moving from lying on your back to sitting on the side of a flat bed without using bedrails?: A Little Help needed moving to and from a bed to a chair (including a wheelchair)?: None Help needed standing up from a chair using your arms  (e.g., wheelchair or bedside chair)?: None Help needed to walk in hospital room?: None Help needed climbing 3-5 steps with a railing? : A Little 6 Click Score: 22    End of Session Equipment Utilized During Treatment: Gait belt;Left knee immobilizer Activity Tolerance: Patient tolerated treatment well Patient left: in chair;with call bell/phone within reach;with chair alarm set Nurse Communication: Mobility status PT Visit Diagnosis: Difficulty in walking, not elsewhere classified (R26.2)     Time: [id number] PT Time Calculation (min) (ACUTE ONLY): 30 min  Charges:  $Gait Training: 8-22 mins $Therapeutic Exercise: 8-22 mins                     Ralene BatheUhlenberg, [place] Kistler PT 07/31/2019  Acute Rehabilitation Services Pager [phone] Office [phone]

## 2019-07-31 NOTE — Plan of Care (Signed)
  Problem: Education: Goal: Knowledge of General Education information will improve Description: Including pain rating scale, medication(s)/side effects and non-pharmacologic comfort measures Outcome: Progressing   Problem: Coping: Goal: Level of anxiety will decrease Outcome: Progressing   Problem: Elimination: Goal: Will not experience complications related to urinary retention Outcome: Progressing   Problem: Elimination: Goal: Will not experience complications related to bowel motility Outcome: Progressing   Problem: Pain Management: Goal: Pain level will decrease with appropriate interventions Outcome: Progressing   Problem: Clinical Measurements: Goal: Postoperative complications will be avoided or minimized Outcome: Progressing

## 2019-07-31 NOTE — Progress Notes (Signed)
Home health agencies that serve [phone].        [place] Quality of Patient Care Rating Patient Survey Summary Rating  [facility] [phone] 3 out of 5 stars 4 out of [place] [phone] 3 out of 5 stars 4 out of [place] [phone] 3 out of 5 stars 4 out of [place] (336) [phone] 3  out of 5 stars 4 out of [place] [phone] 4 out of 5 stars 4 out of [place] [phone] 4 out of 5 stars 4 out of [place] [phone]  out of 5 stars 4 out of [place] [phone]  out of 5 stars 4 out of [place] [phone] 4 out of 5 stars 4 out of [place] [phone] 4 out of 5 stars 4 out of [place] [phone] 3  out of 5 stars 4 out of [place] [phone] 3 out of 5 stars 4 out of [place] [phone] 2  out of 5 stars 3 out of 5 stars  [facility] [phone] 4 out of 5 stars Not [place] [phone] 3 out of 5 stars 4 out of 5 stars  [facility] (336) [phone] 3  out of 5 stars 3 out of 5 stars  [facility] [phone]) [phone] 3  out of 5 stars 4 out of 5 stars  [facility] [phone] 3  out of [place] [phone] 3  out of 5 stars 4 out of [place] [phone] 3  out of 5 stars 3 out of [place] [phone] 3  out of 5 stars Not [place] [phone] 3  out of 5 stars 4 out of [place] [phone] 4  out of 5 stars 3 out of [place] number Footnote as displayed on [place]  1 This agency provides services under a federal waiver program to non-traditional, chronic long term population.  2 This agency provides services to a special needs population.  3 Not Available.  4 The number of patient episodes for this measure is too small to report.  5 This measure currently does not have data or provider has been certified/recertified for less than 6 months.  6 The national average for this measure is not provided because of state-to-state differences in data collection.  7 Medicare is not displaying rates for this measure for any home health agency, because of an issue with the data.  8 There were problems with the data and they are being corrected.  9 Zero, or very few, patients met the survey's rules for inclusion. The scores shown, if any, reflect a very small number of surveys and may not accurately tell how an  agency is doing.  10 Survey results are based on less than 12 months of data.  11 Fewer than 70 patients completed the survey. Use the scores shown, if any, with caution as the number of surveys may be too low to accurately tell how an agency is doing.  12 No survey results are available for this period.  13 Data suppressed by CMS for one or more quarters.

## 2019-07-31 NOTE — Progress Notes (Signed)
Physical Therapy Treatment Patient Details Name: Robin Evans MRN: [id number] DOB: 1951-11-01 Today's Date: 07/31/2019    History of Present Illness Pt s/p L TKR and with hx of R TKR and back surgery    PT Comments    Pt ambulated 100' with RW, distance limited by L knee pain. Pt is progressing well with mobility.   Follow Up Recommendations  Follow surgeon's recommendation for DC plan and follow-up therapies     Equipment Recommendations  None recommended by PT    Recommendations for Other Services       Precautions / Restrictions Precautions Precautions: Knee Required Braces or Orthoses: Knee Immobilizer - Left Knee Immobilizer - Left: Discontinue once straight leg raise with < 10 degree lag Restrictions Weight Bearing Restrictions: No Other Position/Activity Restrictions: WBAT    Mobility  Bed Mobility Overal bed mobility: Modified Independent Bed Mobility: Supine to Sit;Sit to Supine     Supine to sit: Modified independent (Device/Increase time);HOB elevated Sit to supine: Modified independent (Device/Increase time)   General bed mobility comments: HOB up, used rail; instructed pt to self assist LLE into bed with RLE  Transfers Overall transfer level: Needs assistance Equipment used: Rolling walker (2 wheeled) Transfers: Sit to/from Stand Sit to Stand: Supervision         General transfer comment: VCs hand placement  Ambulation/Gait Ambulation/Gait assistance: Supervision Gait Distance (Feet): 100 Feet Assistive device: Rolling walker (2 wheeled) Gait Pattern/deviations: Step-through pattern;Decreased stride length Gait velocity: decr   General Gait Details: good sequencing, no loss of balance   Stairs Stairs: (pt stated she has no stairs)           Wheelchair Mobility    Modified Rankin (Stroke Patients Only)       Balance Overall balance assessment: Needs assistance Sitting-balance support: No upper extremity supported;Feet  supported Sitting balance-Leahy Scale: Good     Standing balance support: Bilateral upper extremity supported Standing balance-Leahy Scale: Fair                              Cognition Arousal/Alertness: Awake/alert Behavior During Therapy: WFL for tasks assessed/performed Overall Cognitive Status: Within Functional Limits for tasks assessed                                        Exercises Total Joint Exercises Ankle Circles/Pumps: AROM;Both;10 reps;Supine Quad Sets: AROM;Left;5 reps;Supine  Long Arc Quad: AROM;Left;10 reps;Seated Knee Flexion: AROM;Left;10 reps;Seated Goniometric ROM: 5-65* AAROM L knee    General Comments        Pertinent Vitals/Pain Pain Score: 6  Pain Location: L knee with walking Pain Descriptors / Indicators: Sore Pain Intervention(s): Limited activity within patient's tolerance;Monitored during session;Patient requesting pain meds-RN notified;Ice applied    Home Living                      Prior Function            PT Goals (current goals can now be found in the care plan section) Acute Rehab PT Goals Patient Stated Goal: Regain IND, walk without pain PT Goal Formulation: With patient Time For Goal Achievement: 08/06/19 Potential to Achieve Goals: Good Progress towards PT goals: Progressing toward goals    Frequency    7X/week      PT Plan Current plan remains appropriate  Co-evaluation              AM-PAC PT "6 Clicks" Mobility   Outcome Measure  Help needed turning from your back to your side while in a flat bed without using bedrails?: None Help needed moving from lying on your back to sitting on the side of a flat bed without using bedrails?: A Little Help needed moving to and from a bed to a chair (including a wheelchair)?: None Help needed standing up from a chair using your arms (e.g., wheelchair or bedside chair)?: None Help needed to walk in hospital room?: None Help needed  climbing 3-5 steps with a railing? : A Little 6 Click Score: 22    End of Session Equipment Utilized During Treatment: Gait belt Activity Tolerance: Patient tolerated treatment well Patient left: with call bell/phone within reach;in bed;with nursing/sitter in room Nurse Communication: Mobility status PT Visit Diagnosis: Difficulty in walking, not elsewhere classified (R26.2)     Time: 1610-96041218-1231 PT Time Calculation (min) (ACUTE ONLY): 13 min  Charges:  $Gait Training: 8-22 mins                    Ralene BatheUhlenberg, Jamariyah Johannsen Kistler PT 07/31/2019  Acute Rehabilitation Services Pager [phone] Office [phone]

## 2019-08-01 LAB — CBC
HCT: 38 % (ref 36.0–46.0)
Hemoglobin: 12.4 g/dL (ref 12.0–15.0)
MCH: 29.9 pg (ref 26.0–34.0)
MCHC: 32.6 g/dL (ref 30.0–36.0)
MCV: 91.6 fL (ref 80.0–100.0)
Platelets: 158 10*3/uL (ref 150–400)
RBC: 4.15 MIL/uL (ref 3.87–5.11)
RDW: 13.7 % (ref 11.5–15.5)
WBC: 9.3 10*3/uL (ref 4.0–10.5)
nRBC: 0 % (ref 0.0–0.2)

## 2019-08-01 NOTE — Discharge Summary (Signed)
Orthopedic Discharge Summary        Physician Discharge Summary  Patient ID: Robin PaceJoni Evans MRN: [id number] DOB/AGE: 03/17/1951 68 y.o.  Admit date: 07/30/2019 Discharge date: 08/01/2019   Procedures:  Procedure(s) (LRB): TOTAL KNEE ARTHROPLASTY (Left)  Attending Physician:  Dr. Malon KindleSteven Mega Kinkade  Admission Diagnoses:   Left knee OA end stage  Discharge Diagnoses:  same   Past Medical History:  Diagnosis Date  . Arthritis   . Bladder incontinence    "i use to get botox injections for it but i havent been able to go back  since covid started "  . Dysrhythmia    occ palpitations  no cardiac dr-  . GERD (gastroesophageal reflux disease)   . Heart murmur    per patient , 3 month asgo her PCP Dr Carolyne FiscalSpry sent her for ECHO eval d/t auscultated murmur on assessment , per patient ECHO was norma   . History of hiatal hernia   . Hypertension   . Hypothyroidism   . Leg wound, left    injury occurred approx 3 weeks ago , area is dime sized , center is dark in color , appears to be instages of healing    . Palpitations    " i get them every once in while , very seldom, i dont even pay any attention to them anymore"   . Skin tear of left upper extremity    left anterior forearm , reports injury occurred on 07-21-2019 snagged her skin on a bolt on a fence  , wound is approx diamter of dime with observed erythemia yet cool to the touch ., patient is tending to wound with neosporin   . Sleep apnea    cpap, no device in use that this time     PCP: Raynelle JanSpry, Heather M., MD   Discharged Condition: good  Hospital Course:  Patient underwent the above stated procedure on 07/30/2019. Patient tolerated the procedure well and brought to the recovery room in good condition and subsequently to the floor. Patient had an uncomplicated hospital course and was stable for discharge.   Disposition: Discharge disposition: 01-Home or Self Care      with follow up in 2 weeks   Follow-up Information    Beverely LowNorris, Kadan Millstein, MD. Call in 2 weeks.   Specialty: Orthopedic Surgery Why: [phone] Contact information: [address] [place] [address] [phone]        [facility] Follow up.   Specialty: Home Health Services Why: agency will provide home health physical therapy, agency will call you to schedule first visit. Contact information: [address] [place] [address] [phone]           Discharge Instructions    Call MD / Call 911   Complete by: As directed    If you experience chest pain or shortness of breath, CALL 911 and be transported to the hospital emergency room.  If you develope a fever above 101 F, pus (white drainage) or increased drainage or redness at the wound, or calf pain, call your surgeon's office.   Constipation Prevention   Complete by: As directed    Drink plenty of fluids.  Prune juice may be helpful.  You may use a stool softener, such as Colace (over the counter) 100 mg twice a day.  Use MiraLax (over the counter) for constipation as needed.   Diet - low sodium heart healthy   Complete by: As directed    Driving  restrictions   Complete by: As directed    No driving for 3 weeks   Face-to-face encounter (required for Medicare/Medicaid patients)   Complete by: As directed    I Nicholes Stairs certify that this patient is under my care and that I, or a nurse practitioner or physician's assistant working with me, had a face-to-face encounter that meets the physician face-to-face encounter requirements with this patient on 07/31/2019. The encounter with the patient was in whole, or in part for the following medical condition(s) which is the primary reason for home health care (List medical condition): immobility due to left total knee replacement   The encounter with the patient was in whole, or in part, for the following medical condition, which is the primary reason for home health care: immobility   I certify that,  based on my findings, the following services are medically necessary home health services: Physical therapy   Reason for Medically Necessary Home Health Services: Therapy- Personnel officer, Training and development officer and Stair Training   My clinical findings support the need for the above services: Pain interferes with ambulation/mobility   Further, I certify that my clinical findings support that this patient is homebound due to: Pain interferes with ambulation/mobility   Home Health   Complete by: As directed    To provide the following care/treatments: PT   Increase activity slowly as tolerated   Complete by: As directed       Allergies as of 08/01/2019      Reactions   Vancomycin Hives, Itching      Medication List    TAKE these medications   acetaminophen 650 MG CR tablet Commonly known as: TYLENOL Take 650 mg by mouth every 8 (eight) hours as needed for pain.   aspirin 81 MG chewable tablet Commonly known as: Aspirin Childrens Chew 1 tablet (81 mg total) by mouth 2 (two) times daily.   DULoxetine 30 MG capsule Commonly known as: CYMBALTA Take 30 mg by mouth at bedtime.   hydrochlorothiazide 12.5 MG tablet Commonly known as: HYDRODIURIL Take 12.5 mg by mouth daily.   levothyroxine 125 MCG tablet Commonly known as: SYNTHROID Take 125 mcg by mouth daily before breakfast.   losartan 100 MG tablet Commonly known as: COZAAR Take 100 mg by mouth daily.   methocarbamol 500 MG tablet Commonly known as: Robaxin Take 1 tablet (500 mg total) by mouth every 6 (six) hours as needed.   omeprazole 20 MG capsule Commonly known as: PRILOSEC Take 20 mg by mouth daily as needed (heartburn/indigestion.).   oxyCODONE-acetaminophen 5-325 MG tablet Commonly known as: Percocet Take 1-2 tablets by mouth every 4 (four) hours as needed for severe pain.   pravastatin 40 MG tablet Commonly known as: PRAVACHOL Take 40 mg by mouth every evening.         Signed: Augustin Schooling 08/01/2019,  9:09 AM  [facility] is now [facility] [address] [place], [place], [place] [address] Phone: [place]

## 2019-08-01 NOTE — Progress Notes (Signed)
RN reviewed discharge instructions with patient. All questions answered.   Paperwork reviewed.   Prescriptions sent to pharmacy.   NT rolled patient down with all belongings to family car.

## 2019-08-01 NOTE — Progress Notes (Signed)
Physical Therapy Treatment Patient Details Name: Robin PaceJoni Evans MRN: [id number] DOB: 1951/06/01 Today's Date: 08/01/2019    History of Present Illness Pt s/p L TKR and with hx of R TKR and back surgery    PT Comments    Pt progressing well with all mobility tasks and eager for dc home.   Follow Up Recommendations  Follow surgeon's recommendation for DC plan and follow-up therapies     Equipment Recommendations  None recommended by PT    Recommendations for Other Services       Precautions / Restrictions Precautions Precautions: Knee Restrictions Weight Bearing Restrictions: No Other Position/Activity Restrictions: WBAT    Mobility  Bed Mobility Overal bed mobility: Modified Independent                Transfers Overall transfer level: Needs assistance Equipment used: Rolling walker (2 wheeled) Transfers: Sit to/from Stand Sit to Stand: Supervision         General transfer comment: VCs hand placement  Ambulation/Gait Ambulation/Gait assistance: Supervision Gait Distance (Feet): 140 Feet Assistive device: Rolling walker (2 wheeled) Gait Pattern/deviations: Step-through pattern;Decreased stride length     General Gait Details: cues for position from [employer]W   Stairs             Wheelchair Mobility    Modified Rankin (Stroke Patients Only)       Balance Overall balance assessment: Needs assistance Sitting-balance support: No upper extremity supported;Feet supported Sitting balance-Leahy Scale: Good     Standing balance support: Bilateral upper extremity supported Standing balance-Leahy Scale: Fair                              Cognition Arousal/Alertness: Awake/alert Behavior During Therapy: WFL for tasks assessed/performed Overall Cognitive Status: Within Functional Limits for tasks assessed                                        Exercises Total Joint Exercises Ankle Circles/Pumps: AROM;Both;Supine;20  reps Quad Sets: AROM;Left;Supine;15 reps Heel Slides: AAROM;Left;Supine;20 reps Hip ABduction/ADduction: AROM;Left;10 reps;Supine Straight Leg Raises: AAROM;Left;Supine;20 reps Long Arc Quad: AROM;Left;10 reps;Seated    General Comments        Pertinent Vitals/Pain Pain Assessment: 0-10 Pain Score: 5  Pain Location: L knee with walking Pain Descriptors / Indicators: Sore Pain Intervention(s): Limited activity within patient's tolerance;Monitored during session;Premedicated before session;Ice applied    Home Living                      Prior Function            PT Goals (current goals can now be found in the care plan section) Acute Rehab PT Goals Patient Stated Goal: Regain IND, walk without pain PT Goal Formulation: With patient Time For Goal Achievement: 08/06/19 Potential to Achieve Goals: Good Progress towards PT goals: Progressing toward goals    Frequency    7X/week      PT Plan Current plan remains appropriate    Co-evaluation              AM-PAC PT "6 Clicks" Mobility   Outcome Measure  Help needed turning from your back to your side while in a flat bed without using bedrails?: None Help needed moving from lying on your back to sitting on the side of a flat bed without using bedrails?: A  Little Help needed moving to and from a bed to a chair (including a wheelchair)?: None Help needed standing up from a chair using your arms (e.g., wheelchair or bedside chair)?: None Help needed to walk in hospital room?: None Help needed climbing 3-5 steps with a railing? : A Little 6 Click Score: 22    End of Session Equipment Utilized During Treatment: Gait belt Activity Tolerance: Patient tolerated treatment well Patient left: in chair;with call bell/phone within reach;with chair alarm set Nurse Communication: Mobility status PT Visit Diagnosis: Difficulty in walking, not elsewhere classified (R26.2)     Time: 8101-7510 PT Time Calculation  (min) (ACUTE ONLY): 26 min  Charges:  $Gait Training: 8-22 mins $Therapeutic Exercise: 8-22 mins                     [place] Pager [phone] Office [phone]    Robin Evans 08/01/2019, 1:50 PM

## 2019-08-01 NOTE — Progress Notes (Signed)
Orthopedics Progress Note  Subjective: Patient doing well. Pain controlled. Therapy going well.  Objective:  Vitals:   07/31/19 2117 08/01/19 0346  BP: (!) 151/61 (!) 145/70  Pulse: 71 71  Resp: 16 18  Temp: 98.4 F (36.9 C) 98.1 F (36.7 C)  SpO2: 96% 97%    General: Awake and alert  Musculoskeletal: Left knee incision CDI, no swelling or drainage or erythema. Neg Homans Neurovascularly intact  Lab Results  Component Value Date   WBC 9.3 08/01/2019   HGB 12.4 08/01/2019   HCT 38.0 08/01/2019   MCV 91.6 08/01/2019   PLT 158 08/01/2019       Component Value Date/Time   NA 136 07/31/2019 0241   K 3.8 07/31/2019 0241   CL 100 07/31/2019 0241   CO2 25 07/31/2019 0241   GLUCOSE 131 (H) 07/31/2019 0241   BUN 13 07/31/2019 0241   CREATININE 0.82 07/31/2019 0241   CALCIUM 8.7 (L) 07/31/2019 0241   GFRNONAA >60 07/31/2019 0241   GFRAA >60 07/31/2019 0241    No results found for: INR, PROTIME  Assessment/Plan: POD #2 s/p Procedure(s): TOTAL KNEE ARTHROPLASTY Discharge to home after therapy.  Home health PT. OT - face to face done Follow up in two weeks DVT prophylaxis  Doran Heater. Veverly Fells, MD 08/01/2019 9:04 AM

## 2019-08-02 ENCOUNTER — Encounter (HOSPITAL_COMMUNITY): Payer: Self-pay | Admitting: Orthopedic Surgery

## 2020-07-16 DIAGNOSIS — J189 Pneumonia, unspecified organism: Secondary | ICD-10-CM

## 2020-07-16 HISTORY — DX: Pneumonia, unspecified organism: J18.9

## 2021-01-08 NOTE — H&P (Signed)
ADMISSION H&P  Patient is being admitted for left total knee open scar debridement.  Subjective:   Chief Complaint: Arthrofibrosis of left TKA  HPI: Robin Evans, 70 y.o. female, has a history of pain and functional disability in the left knee due to scarring and has failed non-surgical conservative treatments for greater than 12 weeks to include NSAID's and/or analgesics, supervised PT with diminished ADL's post treatment, use of assistive devices and activity modification.  Onset of symptoms was gradual, starting 1+ years ago with gradually worsening course since that time.  The patient noted prior procedures on the knee to include  arthroplasty on the left knee(s).  Patient currently rates pain in the left knee(s) at 10 out of 10 with standing or sitting, otherwise she has no pain.  Patient has evidence of previous TKA by imaging studies. There is no active infection.  Risks, benefits and expectations were discussed with the patient.  Risks including but not limited to the risk of anesthesia, blood clots, nerve damage, blood vessel damage, failure of the prosthesis, infection and up to and including death.  Patient understand the risks, benefits and expectations and wishes to proceed with surgery.   D/C Plans:       Home (prefers to spend the night, but ok with SD if needed)  Post-op Meds:       No Rx given   Tranexamic Acid:      To be given - IV   Decadron:      Is to be given  FYI:      ASA  Norco  DME:  Rx sent for - RW & 3-N-1  PT:   OPPT   Pharmacy: [facility] -  [address], [place], [place]   Patient Active Problem List   Diagnosis Date Noted  . Status post total knee replacement, left 07/30/2019  . H/O total knee replacement, right 03/07/2017   Past Medical History:  Diagnosis Date  . Arthritis   . Bladder incontinence    "i use to get botox injections for it but i havent been able to go back  since covid started "  . Dysrhythmia    occ palpitations  no cardiac dr-  .  GERD (gastroesophageal reflux disease)   . Heart murmur    per patient , 3 month asgo her PCP Dr Carolyne Fiscal sent her for ECHO eval d/t auscultated murmur on assessment , per patient ECHO was norma   . History of hiatal hernia   . Hypertension   . Hypothyroidism   . Leg wound, left    injury occurred approx 3 weeks ago , area is dime sized , center is dark in color , appears to be instages of healing    . Palpitations    " i get them every once in while , very seldom, i dont even pay any attention to them anymore"   . Skin tear of left upper extremity    left anterior forearm , reports injury occurred on 07-21-2019 snagged her skin on a bolt on a fence  , wound is approx diamter of dime with observed erythemia yet cool to the touch ., patient is tending to wound with neosporin   . Sleep apnea    cpap, no device in use that this time     Past Surgical History:  Procedure Laterality Date  . BACK SURGERY  2005  . CARPAL TUNNEL RELEASE Right 90's  . CESAREAN SECTION     x2  . HAND  FUSION Right 2004   fusion  . INJECTION KNEE Left 03/07/2017   Procedure: LEFT KNEE INJECTION;  Surgeon: Beverely Low, MD;  Location: [facility] OR;  Service: Orthopedics;  Laterality: Left;  . ROTATOR CUFF REPAIR Left 2019   dr Beverely Low; cone say surgery   . TOTAL KNEE ARTHROPLASTY Right 03/07/2017   Procedure: RIGHT TOTAL KNEE ARTHROPLASTY;  Surgeon: Beverely Low, MD;  Location: [facility] OR;  Service: Orthopedics;  Laterality: Right;  . TOTAL KNEE ARTHROPLASTY Left 07/30/2019   Procedure: TOTAL KNEE ARTHROPLASTY;  Surgeon: Beverely Low, MD;  Location: [facility] ORS;  Service: Orthopedics;  Laterality: Left;    No current facility-administered medications for this encounter.   Current Outpatient Medications  Medication Sig Dispense Refill Last Dose  . acetaminophen (TYLENOL) 650 MG CR tablet Take 1,300 mg by mouth every 8 (eight) hours as needed for pain.     Marland Kitchen ammonium lactate (LAC-HYDRIN) 12 % lotion Apply 1 application topically 2  (two) times daily.     . Ascorbic Acid (VITAMIN C WITH ROSE HIPS) 500 MG tablet Take 500 mg by mouth daily.     . Calcium Carb-Cholecalciferol (CALCIUM 600 + D PO) Take 1 tablet by mouth daily.     . DULoxetine (CYMBALTA) 30 MG capsule Take 30 mg by mouth at bedtime.     Marland Kitchen levothyroxine (SYNTHROID, LEVOTHROID) 125 MCG tablet Take 125 mcg by mouth daily before breakfast.   5   . losartan-hydrochlorothiazide (HYZAAR) 100-12.5 MG tablet Take 1 tablet by mouth daily.     Marland Kitchen omeprazole (PRILOSEC) 20 MG capsule Take 20 mg by mouth daily as needed (heartburn/indigestion.).      Marland Kitchen Potassium 99 MG TABS Take 99 mg by mouth daily.     . pravastatin (PRAVACHOL) 40 MG tablet Take 40 mg by mouth every evening.  3   . zinc gluconate 50 MG tablet Take 50 mg by mouth daily.      Allergies  Allergen Reactions  . Vancomycin Hives and Itching    Social History   Tobacco Use  . Smoking status: Former Smoker    Packs/day: 1.00    Years: 22.00    Pack years: 22.00    Quit date: 02/27/1988    Years since quitting: 32.8  . Smokeless tobacco: Never Used  Substance Use Topics  . Alcohol use: No       Review of Systems  Constitutional: Negative.   HENT: Negative.   Eyes: Negative.   Respiratory: Negative.   Cardiovascular: Negative.   Gastrointestinal: Negative.   Genitourinary: Negative.   Musculoskeletal: Positive for joint pain.  Skin: Negative.   Neurological: Negative.   Endo/Heme/Allergies: Negative.   Psychiatric/Behavioral: Negative.      Objective:  Physical Exam Constitutional:      Appearance: She is well-developed.  HENT:     Head: Normocephalic.  Eyes:     Pupils: Pupils are equal, round, and reactive to light.  Neck:     Thyroid: No thyromegaly.     Vascular: No JVD.     Trachea: No tracheal deviation.  Cardiovascular:     Rate and Rhythm: Normal rate and regular rhythm.     Pulses: Intact distal pulses.  Pulmonary:     Effort: Pulmonary effort is normal. No respiratory  distress.     Breath sounds: Normal breath sounds. No wheezing.  Abdominal:     Palpations: Abdomen is soft.     Tenderness: There is no abdominal tenderness. There is no guarding.  Musculoskeletal:     Cervical back: Neck supple.     Left knee: Swelling and crepitus present. No lacerations (healed previous incision). Decreased range of motion. Tenderness present.  Lymphadenopathy:     Cervical: No cervical adenopathy.  Skin:    General: Skin is warm and dry.  Neurological:     Mental Status: She is alert and oriented to person, place, and time.  Psychiatric:        Mood and Affect: Mood and affect normal.      Labs:  Estimated body mass index is 31.75 kg/m as calculated from the following:   Height as of 07/30/19: 5\' 9"  (1.753 m).   Weight as of 07/30/19: 97.5 kg.   Imaging Review Plain radiographs demonstrate previous TKA of the left knee. The overall alignment is neutral. The bone quality appears to be good for age and reported activity level.      Assessment/Plan:   Arthrofibrosis of left TKA  The patient history, physical examination, clinical judgment of the provider and imaging studies are consistent with arthrofibrosis of left TKA and  left total knee open scar debridement is deemed medically necessary.  The risks and benefits of surgery were presented and reviewed. The risks due to aseptic loosening, infection, stiffness, patella tracking problems, thromboembolic complications and other imponderables were discussed. The patient acknowledged the explanation, agreed to proceed with the plan and consent was signed. Patient is being admitted for  treatment for surgery, pain control, PT, OT, prophylactic antibiotics, VTE prophylaxis, progressive ambulation and ADL's and discharge planning. The patient is planning to be discharged home.

## 2021-01-11 NOTE — Patient Instructions (Addendum)
DUE TO COVID-19 ONLY ONE VISITOR IS ALLOWED TO COME WITH YOU AND STAY IN THE WAITING ROOM ONLY DURING PRE OP AND PROCEDURE DAY OF SURGERY. THE 1 VISITOR  MAY VISIT WITH YOU AFTER SURGERY IN YOUR PRIVATE ROOM DURING VISITING HOURS ONLY!  YOU NEED TO HAVE A COVID 19 TEST ON_2/4______ @_8 :45______, THIS TEST MUST BE DONE BEFORE SURGERY,  COVID TESTING SITE [address] [place] , IT IS ON THE RIGHT GOING OUT [address] APPROXIMATELY  2 MINUTES PAST [employer] ON THE RIGHT. ONCE YOUR COVID TEST IS COMPLETED,  PLEASE BEGIN THE QUARANTINE INSTRUCTIONS AS OUTLINED IN YOUR HANDOUT.                Robin Evans   Your procedure is scheduled on: 01/23/21   Report to [facility] Main  Entrance   Report to admitting at 9:30 AM     Call this number if you have problems the morning of surgery [phone]     BRUSH YOUR TEETH MORNING OF SURGERY AND RINSE YOUR MOUTH OUT, NO CHEWING GUM CANDY OR MINTS.  No food after midnight.    You may have clear liquid until 8:30 AM.    At 8:00 AM drink pre surgery drink.   Nothing by mouth after 8:30 AM.    Take these medicines the morning of surgery with A SIP OF WATER: Levothyroxine, Omeprazole              Bring your mask and tubing to the hospital                               You may not have any metal on your body including hair pins and              piercings  Do not wear jewelry, make-up, lotions, powders or perfumes, deodorant             Do not wear nail polish on your fingernails.  Do not shave  48 hours prior to surgery.     Do not bring valuables to the hospital. [facility] IS NOT             RESPONSIBLE   FOR VALUABLES.  Contacts, dentures or bridgework may not be worn into surgery.      Patients discharged the day of surgery will not be allowed to drive home  . IF YOU ARE HAVING SURGERY AND GOING HOME THE SAME DAY, YOU MUST HAVE AN ADULT TO DRIVE YOU HOME AND BE WITH YOU FOR 24 HOURS.  YOU MAY  GO HOME BY TAXI OR UBER OR ORTHERWISE, BUT AN ADULT MUST ACCOMPANY YOU HOME AND STAY WITH YOU FOR 24 HOURS.  Name and phone number of your driver:  Special Instructions: N/A              Please read over the following fact sheets you were given: _____________________________________________________________________             [facility] - Preparing for Surgery Before surgery, you can play an important role.   Because skin is not sterile, your skin needs to be as free of germs as possible .  You can reduce the number of germs on your skin by washing with CHG (chlorahexidine gluconate) soap before surgery.   CHG is an antiseptic cleaner which kills germs and bonds with the skin to continue killing germs even after washing.  Please DO NOT use if you have an allergy to CHG or antibacterial soaps.   If your skin becomes reddened/irritated stop using the CHG and inform your nurse when you arrive at Short Stay. Do not shave (including legs and underarms) for at least 48 hours prior to the first CHG shower.    Please follow these instructions carefully:  1.  Shower with CHG Soap the night before surgery and the  morning of Surgery.  2.  If you choose to wash your hair, wash your hair first as usual with your  normal  shampoo.  3.  After you shampoo, rinse your hair and body thoroughly to remove the  shampoo.                                        4.  Use CHG as you would any other liquid soap.  You can apply chg directly  to the skin and wash                       Gently with a scrungie or clean washcloth.  5.  Apply the CHG Soap to your body ONLY FROM THE NECK DOWN.   Do not use on face/ open                           Wound or open sores. Avoid contact with eyes, ears mouth and genitals (private parts).                       Wash face,  Genitals (private parts) with your normal soap.             6.  Wash thoroughly, paying special attention to the area where your surgery  will be performed.  7.   Thoroughly rinse your body with warm water from the neck down.  8.  DO NOT shower/wash with your normal soap after using and rinsing off  the CHG Soap.             9.  Pat yourself dry with a clean towel.            10.  Wear clean pajamas.            11.  Place clean sheets on your bed the night of your first shower and do not  sleep with pets. Day of Surgery : Do not apply any lotions/deodorants the morning of surgery.  Please wear clean clothes to the hospital/surgery center.  FAILURE TO FOLLOW THESE INSTRUCTIONS MAY RESULT IN THE CANCELLATION OF YOUR SURGERY PATIENT SIGNATURE_________________________________  NURSE SIGNATURE__________________________________  ________________________________________________________________________   Robin Evans  An incentive spirometer is a tool that can help keep your lungs clear and active. This tool measures how well you are filling your lungs with each breath. Taking long deep breaths may help reverse or decrease the chance of developing breathing (pulmonary) problems (especially infection) following:  A long period of time when you are unable to move or be active. BEFORE THE PROCEDURE   If the spirometer includes an indicator to show your best effort, your nurse or respiratory therapist will set it to a desired goal.  If possible, sit up straight or lean slightly forward. Try not to slouch.  Hold the incentive spirometer in an upright position. INSTRUCTIONS FOR USE  1. Sit on the edge of your bed if possible, or sit up as far as you can in bed or on a chair. 2. Hold the incentive spirometer in an upright position. 3. Breathe out normally. 4. Place the mouthpiece in your mouth and seal your lips tightly around it. 5. Breathe in slowly and as deeply as possible, raising the piston or the ball toward the top of the column. 6. Hold your breath for 3-5 seconds or for as long as possible. Allow the piston or ball to fall to the bottom  of the column. 7. Remove the mouthpiece from your mouth and breathe out normally. 8. Rest for a few seconds and repeat Steps 1 through 7 at least 10 times every 1-2 hours when you are awake. Take your time and take a few normal breaths between deep breaths. 9. The spirometer may include an indicator to show your best effort. Use the indicator as a goal to work toward during each repetition. 10. After each set of 10 deep breaths, practice coughing to be sure your lungs are clear. If you have an incision (the cut made at the time of surgery), support your incision when coughing by placing a pillow or rolled up towels firmly against it. Once you are able to get out of bed, walk around indoors and cough well. You may stop using the incentive spirometer when instructed by your caregiver.  RISKS AND COMPLICATIONS  Take your time so you do not get dizzy or light-headed.  If you are in pain, you may need to take or ask for pain medication before doing incentive spirometry. It is harder to take a deep breath if you are having pain. AFTER USE  Rest and breathe slowly and easily.  It can be helpful to keep track of a log of your progress. Your caregiver can provide you with a simple table to help with this. If you are using the spirometer at home, follow these instructions: [place] IF:   You are having difficultly using the spirometer.  You have trouble using the spirometer as often as instructed.  Your pain medication is not giving enough relief while using the spirometer.  You develop fever of 100.5 F (38.1 C) or higher. SEEK IMMEDIATE MEDICAL CARE IF:   You cough up bloody sputum that had not been present before.  You develop fever of 102 F (38.9 C) or greater.  You develop worsening pain at or near the incision site. MAKE SURE YOU:   Understand these instructions.  Will watch your condition.  Will get help right away if you are not doing well or get worse. Document  Released: 04/14/2007 Document Revised: 02/24/2012 Document Reviewed: 06/15/2007 [facility] Patient Information 2014 [place], [place].   ________________________________________________________________________

## 2021-01-12 ENCOUNTER — Encounter (HOSPITAL_COMMUNITY): Payer: Self-pay | Admitting: Physician Assistant

## 2021-01-15 ENCOUNTER — Other Ambulatory Visit: Payer: Self-pay

## 2021-01-15 ENCOUNTER — Encounter (HOSPITAL_COMMUNITY): Payer: Self-pay

## 2021-01-15 ENCOUNTER — Encounter (HOSPITAL_COMMUNITY)
Admission: RE | Admit: 2021-01-15 | Discharge: 2021-01-15 | Disposition: A | Discharge status: Home / Self Care | Payer: No Typology Code available for payment source | Source: Ambulatory Visit | Attending: Orthopedic Surgery | Admitting: Orthopedic Surgery

## 2021-01-15 DIAGNOSIS — I10 Essential (primary) hypertension: Secondary | ICD-10-CM | POA: Insufficient documentation

## 2021-01-15 DIAGNOSIS — Z01818 Encounter for other preprocedural examination: Secondary | ICD-10-CM | POA: Diagnosis not present

## 2021-01-15 LAB — CBC
HCT: 41 % (ref 36.0–46.0)
Hemoglobin: 13.2 g/dL (ref 12.0–15.0)
MCH: 29.7 pg (ref 26.0–34.0)
MCHC: 32.2 g/dL (ref 30.0–36.0)
MCV: 92.1 fL (ref 80.0–100.0)
Platelets: 177 10*3/uL (ref 150–400)
RBC: 4.45 MIL/uL (ref 3.87–5.11)
RDW: 13.2 % (ref 11.5–15.5)
WBC: 6.6 10*3/uL (ref 4.0–10.5)
nRBC: 0 % (ref 0.0–0.2)

## 2021-01-15 NOTE — Progress Notes (Signed)
COVID Vaccine Completed:no Date COVID Vaccine completed: COVID vaccine manufacturer: Pfizer    Moderna   [facility] [employer] [facility]   PCP - Dr. Carlean Purl Cardiologist - Dr. Kathie Rhodes.  [place]  Chest x-ray - no EKG - 01/15/21-chart , epic Stress Test - no ECHO - 2019 for murmur at [facility] Cardiac Cath - no Pacemaker/ICD device last checked:NA  Sleep Study - yes CPAP - No  Fasting Blood Sugar - NA Checks Blood Sugar _____ times a day  Blood Thinner Instructions:NA Aspirin Instructions: Last Dose:  Anesthesia review:   Patient denies shortness of breath, fever, cough and chest pain at PAT appointment yes  Patient verbalized understanding of instructions that were given to them at the PAT appointment. Patient was also instructed that they will need to review over the PAT instructions again at home before surgery. Yes  Pt can climb 3 flights of stairs, do housework and ADLs with out SOB

## 2021-01-19 ENCOUNTER — Other Ambulatory Visit (HOSPITAL_COMMUNITY)
Admission: RE | Admit: 2021-01-19 | Discharge: 2021-01-19 | Disposition: A | Discharge status: Home / Self Care | Payer: Medicare HMO | Source: Ambulatory Visit | Attending: Orthopedic Surgery | Admitting: Orthopedic Surgery

## 2021-01-19 DIAGNOSIS — Z20822 Contact with and (suspected) exposure to covid-19: Secondary | ICD-10-CM | POA: Insufficient documentation

## 2021-01-19 DIAGNOSIS — Z01812 Encounter for preprocedural laboratory examination: Secondary | ICD-10-CM | POA: Diagnosis present

## 2021-01-19 LAB — SARS CORONAVIRUS 2 (TAT 6-24 HRS): SARS Coronavirus 2: NEGATIVE

## 2021-01-22 NOTE — Progress Notes (Signed)
Spoke with pts daughter Susann Givens to inform pt needs to arrive at [facility] admitting at 0900 for scheduled surgical procedure on Tuesday 01/23/2021.

## 2021-01-23 ENCOUNTER — Observation Stay (HOSPITAL_COMMUNITY)
Admission: RE | Admit: 2021-01-23 | Discharge: 2021-01-24 | Disposition: A | Discharge status: Home / Self Care | Payer: No Typology Code available for payment source | Attending: Orthopedic Surgery | Admitting: Orthopedic Surgery

## 2021-01-23 ENCOUNTER — Encounter (HOSPITAL_COMMUNITY)
Admission: RE | Disposition: A | Discharge status: Home / Self Care | Payer: Self-pay | Source: Home / Self Care | Attending: Orthopedic Surgery

## 2021-01-23 ENCOUNTER — Ambulatory Visit (HOSPITAL_COMMUNITY): Payer: No Typology Code available for payment source | Admitting: Certified Registered Nurse Anesthetist

## 2021-01-23 ENCOUNTER — Other Ambulatory Visit: Payer: Self-pay

## 2021-01-23 ENCOUNTER — Encounter (HOSPITAL_COMMUNITY): Payer: Self-pay | Admitting: Orthopedic Surgery

## 2021-01-23 DIAGNOSIS — Z79899 Other long term (current) drug therapy: Secondary | ICD-10-CM | POA: Diagnosis not present

## 2021-01-23 DIAGNOSIS — M2352 Chronic instability of knee, left knee: Secondary | ICD-10-CM | POA: Diagnosis not present

## 2021-01-23 DIAGNOSIS — Z8616 Personal history of COVID-19: Secondary | ICD-10-CM | POA: Diagnosis not present

## 2021-01-23 DIAGNOSIS — Z87891 Personal history of nicotine dependence: Secondary | ICD-10-CM | POA: Diagnosis not present

## 2021-01-23 DIAGNOSIS — E669 Obesity, unspecified: Secondary | ICD-10-CM | POA: Insufficient documentation

## 2021-01-23 DIAGNOSIS — Z6832 Body mass index (BMI) 32.0-32.9, adult: Secondary | ICD-10-CM | POA: Diagnosis not present

## 2021-01-23 DIAGNOSIS — E039 Hypothyroidism, unspecified: Secondary | ICD-10-CM | POA: Insufficient documentation

## 2021-01-23 DIAGNOSIS — Y829 Unspecified medical devices associated with adverse incidents: Secondary | ICD-10-CM | POA: Diagnosis not present

## 2021-01-23 DIAGNOSIS — T84093A Other mechanical complication of internal left knee prosthesis, initial encounter: Principal | ICD-10-CM | POA: Insufficient documentation

## 2021-01-23 DIAGNOSIS — M24662 Ankylosis, left knee: Secondary | ICD-10-CM | POA: Diagnosis not present

## 2021-01-23 DIAGNOSIS — Z96651 Presence of right artificial knee joint: Secondary | ICD-10-CM | POA: Diagnosis not present

## 2021-01-23 DIAGNOSIS — I10 Essential (primary) hypertension: Secondary | ICD-10-CM | POA: Diagnosis not present

## 2021-01-23 HISTORY — PX: SCAR DEBRIDEMENT OF TOTAL KNEE: SHX6544

## 2021-01-23 LAB — TYPE AND SCREEN
ABO/RH(D): O POS
Antibody Screen: NEGATIVE

## 2021-01-23 SURGERY — REVISION, SCAR, KNEE
Anesthesia: Regional | Site: Knee | Laterality: Left

## 2021-01-23 SURGERY — REVISION, SCAR, KNEE
Anesthesia: Spinal | Site: Knee | Laterality: Left

## 2021-01-23 MED ORDER — DEXAMETHASONE SODIUM PHOSPHATE 10 MG/ML IJ SOLN
10.0000 mg | Freq: Once | INTRAMUSCULAR | Status: AC
  Administered 2021-01-24: 10 mg via INTRAVENOUS
  Filled 2021-01-23: qty 1

## 2021-01-23 MED ORDER — MIDAZOLAM HCL 2 MG/2ML IJ SOLN: 2.0000 mg | Freq: Once | INTRAMUSCULAR | Status: AC

## 2021-01-23 MED ORDER — 0.9 % SODIUM CHLORIDE (POUR BTL) OPTIME
TOPICAL | Status: DC | PRN
  Administered 2021-01-23: 1000 mL

## 2021-01-23 MED ORDER — CEFAZOLIN SODIUM-DEXTROSE 2-4 GM/100ML-% IV SOLN
2.0000 g | Freq: Four times a day (QID) | INTRAVENOUS | Status: AC
  Administered 2021-01-23 – 2021-01-24 (×2): 2 g via INTRAVENOUS
  Filled 2021-01-23 (×2): qty 100

## 2021-01-23 MED ORDER — KETOROLAC TROMETHAMINE 15 MG/ML IJ SOLN: 15.0000 mg | Freq: Once | INTRAMUSCULAR | Status: DC | PRN

## 2021-01-23 MED ORDER — FERROUS SULFATE 325 (65 FE) MG PO TABS
325.0000 mg | ORAL_TABLET | Freq: Two times a day (BID) | ORAL | Status: DC
  Administered 2021-01-24: 325 mg via ORAL
  Filled 2021-01-23: qty 1

## 2021-01-23 MED ORDER — MENTHOL 3 MG MT LOZG: 1.0000 | LOZENGE | OROMUCOSAL | Status: DC | PRN

## 2021-01-23 MED ORDER — ORAL CARE MOUTH RINSE: 15.0000 mL | Freq: Once | OROMUCOSAL | Status: AC

## 2021-01-23 MED ORDER — LOSARTAN POTASSIUM-HCTZ 100-12.5 MG PO TABS: 1.0000 | ORAL_TABLET | Freq: Every day | ORAL | Status: DC

## 2021-01-23 MED ORDER — ONDANSETRON HCL 4 MG/2ML IJ SOLN: 4.0000 mg | Freq: Four times a day (QID) | INTRAMUSCULAR | Status: DC | PRN

## 2021-01-23 MED ORDER — BUPIVACAINE IN DEXTROSE 0.75-8.25 % IT SOLN
INTRATHECAL | Status: DC | PRN
  Administered 2021-01-23: 1.6 mL via INTRATHECAL

## 2021-01-23 MED ORDER — HYDROCODONE-ACETAMINOPHEN 7.5-325 MG PO TABS
1.0000 | ORAL_TABLET | ORAL | Status: DC | PRN
  Administered 2021-01-24: 1 via ORAL
  Filled 2021-01-23: qty 1

## 2021-01-23 MED ORDER — DEXAMETHASONE SODIUM PHOSPHATE 10 MG/ML IJ SOLN: 10.0000 mg | Freq: Once | INTRAMUSCULAR | Status: DC

## 2021-01-23 MED ORDER — MIDAZOLAM HCL 2 MG/2ML IJ SOLN
INTRAMUSCULAR | Status: AC
  Administered 2021-01-23: 2 mg via INTRAVENOUS
  Filled 2021-01-23: qty 2

## 2021-01-23 MED ORDER — PANTOPRAZOLE SODIUM 40 MG PO TBEC
40.0000 mg | DELAYED_RELEASE_TABLET | Freq: Every day | ORAL | Status: DC
  Administered 2021-01-24: 40 mg via ORAL
  Filled 2021-01-23: qty 1

## 2021-01-23 MED ORDER — DIPHENHYDRAMINE HCL 12.5 MG/5ML PO ELIX: 12.5000 mg | ORAL_SOLUTION | ORAL | Status: DC | PRN

## 2021-01-23 MED ORDER — PHENYLEPHRINE HCL-NACL 10-0.9 MG/250ML-% IV SOLN
INTRAVENOUS | Status: DC | PRN
  Administered 2021-01-23: 25 ug/min via INTRAVENOUS

## 2021-01-23 MED ORDER — ONDANSETRON HCL 4 MG PO TABS: 4.0000 mg | ORAL_TABLET | Freq: Four times a day (QID) | ORAL | Status: DC | PRN

## 2021-01-23 MED ORDER — SODIUM CHLORIDE 0.9 % IR SOLN
Status: DC | PRN
  Administered 2021-01-23: 1000 mL

## 2021-01-23 MED ORDER — METHOCARBAMOL 500 MG PO TABS: 500.0000 mg | ORAL_TABLET | Freq: Four times a day (QID) | ORAL | Status: DC | PRN

## 2021-01-23 MED ORDER — HYDROCODONE-ACETAMINOPHEN 5-325 MG PO TABS: 1.0000 | ORAL_TABLET | ORAL | Status: DC | PRN

## 2021-01-23 MED ORDER — DOCUSATE SODIUM 100 MG PO CAPS
100.0000 mg | ORAL_CAPSULE | Freq: Two times a day (BID) | ORAL | Status: DC
Start: 2021-01-23 — End: 2021-01-24
  Administered 2021-01-23 – 2021-01-24 (×2): 100 mg via ORAL
  Filled 2021-01-23 (×2): qty 1

## 2021-01-23 MED ORDER — BISACODYL 10 MG RE SUPP: 10.0000 mg | Freq: Every day | RECTAL | Status: DC | PRN

## 2021-01-23 MED ORDER — CHLORHEXIDINE GLUCONATE 0.12 % MT SOLN
15.0000 mL | Freq: Once | OROMUCOSAL | Status: AC
  Administered 2021-01-23: 15 mL via OROMUCOSAL

## 2021-01-23 MED ORDER — KETOROLAC TROMETHAMINE 30 MG/ML IJ SOLN
INTRAMUSCULAR | Status: AC
  Filled 2021-01-23: qty 1

## 2021-01-23 MED ORDER — ONDANSETRON HCL 4 MG/2ML IJ SOLN
INTRAMUSCULAR | Status: DC | PRN
  Administered 2021-01-23: 4 mg via INTRAVENOUS

## 2021-01-23 MED ORDER — ASPIRIN 81 MG PO CHEW
81.0000 mg | CHEWABLE_TABLET | Freq: Two times a day (BID) | ORAL | Status: DC
Start: 2021-01-23 — End: 2021-01-24
  Administered 2021-01-23 – 2021-01-24 (×2): 81 mg via ORAL
  Filled 2021-01-23 (×2): qty 1

## 2021-01-23 MED ORDER — POLYETHYLENE GLYCOL 3350 17 G PO PACK
17.0000 g | PACK | Freq: Two times a day (BID) | ORAL | Status: DC
Start: 2021-01-23 — End: 2021-01-24
  Administered 2021-01-23 – 2021-01-24 (×2): 17 g via ORAL
  Filled 2021-01-23 (×2): qty 1

## 2021-01-23 MED ORDER — PRAVASTATIN SODIUM 20 MG PO TABS: 40.0000 mg | ORAL_TABLET | Freq: Every evening | ORAL | Status: DC

## 2021-01-23 MED ORDER — SODIUM CHLORIDE (PF) 0.9 % IJ SOLN
INTRAMUSCULAR | Status: AC
  Filled 2021-01-23: qty 30

## 2021-01-23 MED ORDER — TRANEXAMIC ACID-NACL 1000-0.7 MG/100ML-% IV SOLN
1000.0000 mg | INTRAVENOUS | Status: AC
Start: 2021-01-23 — End: 2021-01-23
  Administered 2021-01-23: 1000 mg via INTRAVENOUS
  Filled 2021-01-23: qty 100

## 2021-01-23 MED ORDER — HYDROCHLOROTHIAZIDE 12.5 MG PO CAPS
12.5000 mg | ORAL_CAPSULE | Freq: Every day | ORAL | Status: DC
Start: 2021-01-24 — End: 2021-01-24
  Administered 2021-01-24: 12.5 mg via ORAL
  Filled 2021-01-23: qty 1

## 2021-01-23 MED ORDER — METHOCARBAMOL 1000 MG/10ML IJ SOLN
500.0000 mg | Freq: Four times a day (QID) | INTRAVENOUS | Status: DC | PRN
  Filled 2021-01-23: qty 5

## 2021-01-23 MED ORDER — POVIDONE-IODINE 10 % EX SWAB
2.0000 "application " | Freq: Once | CUTANEOUS | Status: AC
  Administered 2021-01-23: 2 via TOPICAL

## 2021-01-23 MED ORDER — PHENYLEPHRINE 40 MCG/ML (10ML) SYRINGE FOR IV PUSH (FOR BLOOD PRESSURE SUPPORT)
PREFILLED_SYRINGE | INTRAVENOUS | Status: DC | PRN
  Administered 2021-01-23 (×2): 40 ug via INTRAVENOUS
  Administered 2021-01-23 (×4): 80 ug via INTRAVENOUS

## 2021-01-23 MED ORDER — METOCLOPRAMIDE HCL 5 MG PO TABS: 5.0000 mg | ORAL_TABLET | Freq: Three times a day (TID) | ORAL | Status: DC | PRN

## 2021-01-23 MED ORDER — CEFAZOLIN SODIUM-DEXTROSE 2-4 GM/100ML-% IV SOLN
2.0000 g | INTRAVENOUS | Status: AC
  Administered 2021-01-23: 2 g via INTRAVENOUS
  Filled 2021-01-23: qty 100

## 2021-01-23 MED ORDER — ONDANSETRON HCL 4 MG/2ML IJ SOLN: 4.0000 mg | Freq: Once | INTRAMUSCULAR | Status: DC | PRN

## 2021-01-23 MED ORDER — LOSARTAN POTASSIUM 50 MG PO TABS
100.0000 mg | ORAL_TABLET | Freq: Every day | ORAL | Status: DC
  Administered 2021-01-24: 100 mg via ORAL
  Filled 2021-01-23: qty 2

## 2021-01-23 MED ORDER — TRANEXAMIC ACID-NACL 1000-0.7 MG/100ML-% IV SOLN
1000.0000 mg | Freq: Once | INTRAVENOUS | Status: AC
  Administered 2021-01-23: 1000 mg via INTRAVENOUS
  Filled 2021-01-23: qty 100

## 2021-01-23 MED ORDER — FENTANYL CITRATE (PF) 100 MCG/2ML IJ SOLN: 25.0000 ug | INTRAMUSCULAR | Status: DC | PRN

## 2021-01-23 MED ORDER — PROPOFOL 500 MG/50ML IV EMUL
INTRAVENOUS | Status: DC | PRN
  Administered 2021-01-23: 85 ug/kg/min via INTRAVENOUS

## 2021-01-23 MED ORDER — KETOROLAC TROMETHAMINE 30 MG/ML IJ SOLN
INTRAMUSCULAR | Status: DC | PRN
  Administered 2021-01-23: 30 mg via INTRA_ARTICULAR

## 2021-01-23 MED ORDER — ACETAMINOPHEN 10 MG/ML IV SOLN: 1000.0000 mg | Freq: Once | INTRAVENOUS | Status: DC | PRN

## 2021-01-23 MED ORDER — DULOXETINE HCL 30 MG PO CPEP
30.0000 mg | ORAL_CAPSULE | Freq: Every day | ORAL | Status: DC
  Administered 2021-01-23: 30 mg via ORAL
  Filled 2021-01-23: qty 1

## 2021-01-23 MED ORDER — BUPIVACAINE-EPINEPHRINE 0.25% -1:200000 IJ SOLN
INTRAMUSCULAR | Status: DC | PRN
  Administered 2021-01-23: 30 mL

## 2021-01-23 MED ORDER — LEVOTHYROXINE SODIUM 125 MCG PO TABS
125.0000 ug | ORAL_TABLET | Freq: Every day | ORAL | Status: DC
  Administered 2021-01-24: 125 ug via ORAL
  Filled 2021-01-23: qty 1

## 2021-01-23 MED ORDER — BUPIVACAINE-EPINEPHRINE (PF) 0.25% -1:200000 IJ SOLN
INTRAMUSCULAR | Status: AC
  Filled 2021-01-23: qty 30

## 2021-01-23 MED ORDER — SODIUM CHLORIDE 0.9 % IV SOLN: INTRAVENOUS | Status: DC

## 2021-01-23 MED ORDER — SODIUM CHLORIDE 0.9 % IJ SOLN: INTRAMUSCULAR | Status: DC | PRN

## 2021-01-23 MED ORDER — LACTATED RINGERS IV SOLN: INTRAVENOUS | Status: DC

## 2021-01-23 MED ORDER — MAGNESIUM CITRATE PO SOLN: 1.0000 | Freq: Once | ORAL | Status: DC | PRN

## 2021-01-23 MED ORDER — CELECOXIB 200 MG PO CAPS
200.0000 mg | ORAL_CAPSULE | Freq: Two times a day (BID) | ORAL | Status: DC
  Administered 2021-01-23 – 2021-01-24 (×2): 200 mg via ORAL
  Filled 2021-01-23 (×2): qty 1

## 2021-01-23 MED ORDER — METOCLOPRAMIDE HCL 5 MG/ML IJ SOLN: 5.0000 mg | Freq: Three times a day (TID) | INTRAMUSCULAR | Status: DC | PRN

## 2021-01-23 MED ORDER — EPHEDRINE SULFATE-NACL 50-0.9 MG/10ML-% IV SOSY
PREFILLED_SYRINGE | INTRAVENOUS | Status: DC | PRN
  Administered 2021-01-23 (×2): 10 mg via INTRAVENOUS

## 2021-01-23 MED ORDER — HYDROMORPHONE HCL 1 MG/ML IJ SOLN: 0.5000 mg | INTRAMUSCULAR | Status: DC | PRN

## 2021-01-23 MED ORDER — PHENOL 1.4 % MT LIQD: 1.0000 | OROMUCOSAL | Status: DC | PRN

## 2021-01-23 MED ORDER — ACETAMINOPHEN 325 MG PO TABS: 325.0000 mg | ORAL_TABLET | Freq: Four times a day (QID) | ORAL | Status: DC | PRN

## 2021-01-23 MED ORDER — SODIUM CHLORIDE 0.9% FLUSH
INTRAVENOUS | Status: DC | PRN
  Administered 2021-01-23: 30 mL

## 2021-01-23 MED ORDER — ALUM & MAG HYDROXIDE-SIMETH 200-200-20 MG/5ML PO SUSP: 15.0000 mL | ORAL | Status: DC | PRN

## 2021-01-23 MED ORDER — ROPIVACAINE HCL 5 MG/ML IJ SOLN
INTRAMUSCULAR | Status: DC | PRN
Start: 2021-01-23 — End: 2021-01-23
  Administered 2021-01-23: 30 mL via PERINEURAL

## 2021-01-23 SURGICAL SUPPLY — 35 items
BLADE SAW SGTL 11.0X1.19X90.0M (BLADE) IMPLANT
BNDG ELASTIC 6X5.8 VLCR STR LF (GAUZE/BANDAGES/DRESSINGS) ×2 IMPLANT
COVER SURGICAL LIGHT HANDLE (MISCELLANEOUS) ×2 IMPLANT
COVER WAND RF STERILE (DRAPES) IMPLANT
CUFF TOURN SGL QUICK 34 (TOURNIQUET CUFF) ×2
CUFF TRNQT CYL 34X4X40X1 (TOURNIQUET CUFF) ×1 IMPLANT
DERMABOND ADVANCED (GAUZE/BANDAGES/DRESSINGS) ×1
DERMABOND ADVANCED .7 DNX12 (GAUZE/BANDAGES/DRESSINGS) ×1 IMPLANT
DRAPE IMP U-DRAPE 54X76 (DRAPES) ×2 IMPLANT
DRESSING AQUACEL AG SP 3.5X10 (GAUZE/BANDAGES/DRESSINGS) ×1 IMPLANT
DRSG AQUACEL AG ADV 3.5X14 (GAUZE/BANDAGES/DRESSINGS) ×2 IMPLANT
DRSG AQUACEL AG SP 3.5X10 (GAUZE/BANDAGES/DRESSINGS) ×2
GLOVE BIOGEL PI IND STRL 6 (GLOVE) ×1 IMPLANT
GLOVE BIOGEL PI INDICATOR 6 (GLOVE) ×1
GLOVE BIOGEL PI ORTHO PRO SZ8 (GLOVE) ×1
GLOVE ECLIPSE 8.0 STRL XLNG CF (GLOVE) ×2 IMPLANT
GLOVE ORTHO TXT STRL SZ7.5 (GLOVE) ×4 IMPLANT
GLOVE PI ORTHO PRO STRL SZ8 (GLOVE) ×1 IMPLANT
GLOVE SURG ENC MOIS LTX SZ6 (GLOVE) ×2 IMPLANT
GLOVE SURG UNDER POLY LF SZ7.5 (GLOVE) ×2 IMPLANT
GOWN STRL REUS W/ TWL LRG LVL3 (GOWN DISPOSABLE) ×1 IMPLANT
GOWN STRL REUS W/TWL 2XL LVL3 (GOWN DISPOSABLE) IMPLANT
GOWN STRL REUS W/TWL LRG LVL3 (GOWN DISPOSABLE) ×2
GOWN STRL REUS W/TWL XL LVL3 (GOWN DISPOSABLE) IMPLANT
INSERT TIB PFC SIG SZ 4 20.0M (Knees) ×2 IMPLANT
KIT TURNOVER KIT A (KITS) ×2 IMPLANT
PACK TOTAL KNEE CUSTOM (KITS) ×2 IMPLANT
PENCIL SMOKE EVACUATOR (MISCELLANEOUS) IMPLANT
SET PAD KNEE POSITIONER (MISCELLANEOUS) ×2 IMPLANT
SUT MNCRL AB 4-0 PS2 18 (SUTURE) ×2 IMPLANT
SUT STRATAFIX PDS+ 0 24IN (SUTURE) ×2 IMPLANT
SUT VIC AB 1 CT1 36 (SUTURE) ×2 IMPLANT
SUT VIC AB 2-0 CT1 27 (SUTURE) ×4
SUT VIC AB 2-0 CT1 TAPERPNT 27 (SUTURE) ×2 IMPLANT
WRAP KNEE MAXI GEL POST OP (GAUZE/BANDAGES/DRESSINGS) ×2 IMPLANT

## 2021-01-23 NOTE — Transfer of Care (Signed)
Immediate Anesthesia Transfer of Care Note  Patient: Robin Evans  Procedure(s) Performed: SCAR DEBRIDEMENT OF TOTAL KNEE (Left Knee)  Patient Location: PACU  Anesthesia Type:Spinal  Level of Consciousness: awake, alert  and oriented  Airway & Oxygen Therapy: Patient Spontanous Breathing and Patient connected to face mask  Post-op Assessment: Report given to RN and Post -op Vital signs reviewed and stable  Post vital signs: Reviewed and stable  Last Vitals:  Vitals Value Taken Time  BP 115/63 01/23/21 1257  Temp    Pulse 83 01/23/21 1300  Resp 17 01/23/21 1300  SpO2 100 % 01/23/21 1300  Vitals shown include unvalidated device data.  Last Pain:  Vitals:   01/23/21 1022  TempSrc:   PainSc: 0-No pain         Complications: No complications documented.

## 2021-01-23 NOTE — Anesthesia Postprocedure Evaluation (Signed)
Anesthesia Post Note  Patient: Robin Evans  Procedure(s) Performed: SCAR DEBRIDEMENT OF TOTAL KNEE (Left Knee)     Patient location during evaluation: PACU Anesthesia Type: Regional and Spinal Level of consciousness: oriented and awake Pain management: pain level controlled Vital Signs Assessment: post-procedure vital signs reviewed and stable Respiratory status: spontaneous breathing, respiratory function stable and patient connected to nasal cannula oxygen Cardiovascular status: blood pressure returned to baseline and stable Postop Assessment: no headache, no backache and no apparent nausea or vomiting Anesthetic complications: no   No complications documented.  Last Vitals:  Vitals:   01/23/21 1414 01/23/21 1518  BP: 135/67 139/80  Pulse: 73 80  Resp: 18 16  Temp: 36.6 C (!) 36.4 C  SpO2: 100% 100%    Last Pain:  Vitals:   01/23/21 1518  TempSrc: Oral  PainSc:                  Catheryn Bacon Davona Kinoshita

## 2021-01-23 NOTE — Evaluation (Signed)
Physical Therapy Evaluation Patient Details Name: Robin Evans MRN: [id number] DOB: 20-Dec-1950 Today's Date: 01/23/2021   History of Present Illness  70 yo female sp L TK rev/poly exchange, scar tissue debridement 01/23/21. Hx of L TKA 2020, R TKA, back sg  Clinical Impression  On eval, pt was Min guard for assist. She walked ~100 feet with a RW. Moderate pain with activity. Will plan to follow and progress activity as tolerated.     Follow Up Recommendations Follow surgeon's recommendation for DC plan and follow-up therapies    Equipment Recommendations  None recommended by PT    Recommendations for Other Services       Precautions / Restrictions Precautions Precautions: Fall Restrictions Weight Bearing Restrictions: No Other Position/Activity Restrictions: WBAT      Mobility  Bed Mobility Overal bed mobility: Needs Assistance Bed Mobility: Supine to Sit     Supine to sit: Supervision;HOB elevated          Transfers Overall transfer level: Needs assistance   Transfers: Sit to/from Stand Sit to Stand: Min guard         General transfer comment: Min guard for safety. Cues hand placement  Ambulation/Gait Ambulation/Gait assistance: Min guard Gait Distance (Feet): 100 Feet Assistive device: Rolling walker (2 wheeled) Gait Pattern/deviations: Step-through pattern;Decreased stride length     General Gait Details: Min guard for safety. Pt tolerated distance well. Pt denied lightheadedness  Stairs            Wheelchair Mobility    Modified Rankin (Stroke Patients Only)       Balance Overall balance assessment: Mild deficits observed, not formally tested                                           Pertinent Vitals/Pain Pain Assessment: 0-10 Pain Score: 5  Pain Location: L knee Pain Descriptors / Indicators: Discomfort;Sore Pain Intervention(s): Monitored during session;Limited activity within patient's tolerance    Home Living  Family/patient expects to be discharged to:: Private residence Living Arrangements: Alone Available Help at Discharge: Family Type of Home: House Home Access: Level entry     Home Layout: One level Home Equipment: Environmental consultant - 4 wheels;Bedside commode      Prior Function Level of Independence: Independent               Hand Dominance        Extremity/Trunk Assessment   Upper Extremity Assessment Upper Extremity Assessment: Overall WFL for tasks assessed    Lower Extremity Assessment Lower Extremity Assessment: Generalized weakness    Cervical / Trunk Assessment Cervical / Trunk Assessment: Normal  Communication   Communication: No difficulties  Cognition Arousal/Alertness: Awake/alert Behavior During Therapy: WFL for tasks assessed/performed Overall Cognitive Status: Within Functional Limits for tasks assessed                                        General Comments      Exercises     Assessment/Plan    PT Assessment Patient needs continued PT services  PT Problem List Decreased range of motion;Decreased strength;Decreased mobility;Decreased activity tolerance;Decreased balance;Decreased knowledge of use of DME;Pain       PT Treatment Interventions DME instruction;Gait training;Therapeutic exercise;Therapeutic activities;Patient/family education;Balance training;Functional mobility training    PT Goals (Current goals can  be found in the Care Plan section)  Acute Rehab PT Goals Patient Stated Goal: home PT Goal Formulation: With patient Time For Goal Achievement: 02/06/21 Potential to Achieve Goals: Good    Frequency 7X/week   Barriers to discharge        Co-evaluation               AM-PAC PT "6 Clicks" Mobility  Outcome Measure Help needed turning from your back to your side while in a flat bed without using bedrails?: A Little Help needed moving from lying on your back to sitting on the side of a flat bed without using  bedrails?: A Little Help needed moving to and from a bed to a chair (including a wheelchair)?: A Little Help needed standing up from a chair using your arms (e.g., wheelchair or bedside chair)?: A Little Help needed to walk in hospital room?: A Little Help needed climbing 3-5 steps with a railing? : A Little 6 Click Score: 18    End of Session Equipment Utilized During Treatment: Gait belt Activity Tolerance: Patient tolerated treatment well Patient left: in chair;with call bell/phone within reach;with family/visitor present   PT Visit Diagnosis: Pain;Other abnormalities of gait and mobility (R26.89) Pain - Right/Left: Left Pain - part of body: Knee    Time: 0272-5366 PT Time Calculation (min) (ACUTE ONLY): 17 min   Charges:   PT Evaluation $PT Eval Low Complexity: 1 Low             Faye Ramsay, PT Acute Rehabilitation  Office: [phone] Pager: [phone]

## 2021-01-23 NOTE — Op Note (Signed)
NAMEMARIELA, REX MEDICAL RECORD [id number] ACCOUNT [id number] DATE OF BIRTH:12-26-1950 FACILITY: [facility] LOCATION: [facility] PHYSICIAN:Judit Awad Rosalia Hammers, MD  OPERATIVE REPORT  DATE OF PROCEDURE:  01/23/2021  PREOPERATIVE DIAGNOSES: 1.  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 2.  Left knee instability.  POSTOPERATIVE DIAGNOSES: 1.  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 2.  Left knee instability.  PROCEDURES: 1.  Left knee open excisional debridement.  This excisional debridement was over an 8-inch incision, which was excised.  Soft tissues are in nonviable and scar tissue and synovium within the knee were excised sharply with the Bovie cautery. 2.  Left knee single component revision, polyethylene was changed from a size 15-20 mm posterior stabilized insert to match her size 4 femur already in place.  SURGEON:  Durene Romans, MD  ASSISTANT:  Lanney Gins, PA-C.  ANESTHESIA:  Regional plus spinal.  DRAINS:  None.  ESTIMATED BLOOD LOSS:  Minimal.  TOURNIQUET:  Up for 20 minutes at 200 mmHg.  INDICATIONS OF THE PROCEDURE:  The patient is a pleasant 70 year old female who was seen and evaluated in the office for left knee pain on referral basis.  She had a history of total knee arthroplasty in 2020.  She also had her right knee replaced, which  has been doing well.  Her left knee developed significant popping and catching in the knee, particularly with an extended to flexed position and vice versa.  This was also visible on exam.  She has some tenderness.  There was no clinical concern for  infection.  I thus discussed with her the diagnosis of patellar clunk and excessive scarring.  I discussed management in terms of arthroscopic versus arthrotomy.  Surgery was scheduled for open arthrotomy with  possible polyethylene exchange at less than  2 years out from surgery once she was evaluated intraoperatively.  Minimal risk for infection or DVT at this point some chance of possible recurrence of the scar around the patella.  However, from my experience, this has been fairly rare.  Consent was  obtained for management of her pain.  DESCRIPTION OF PROCEDURE:  The patient was brought to the operative theater.  Once adequate anesthesia, preoperative antibiotics, Ancef administered as well as tranexamic acid and Decadron, she was positioned supine with a left thigh tourniquet placed.   The left lower extremity was prepped and draped in sterile fashion.  A timeout was performed identifying the patient, the planned procedure and extremity.  The leg was exsanguinated and tourniquet elevated to 200 mmHg.  Her old incision was excised.   Soft tissue planes created.  Median arthrotomy was then made encountering clear synovial fluid.  As I entered the knee and did a routine exposure of her knee replacement.  I did perform a medial synovectomy and scar debridement sharply with Bovie  cautery.  This included up into the suprapatellar pouch.  Once I had the medial aspect of the joint exposed I evaluated the parapatellar region.  As we everted the patella we identified a significant amount of scarring, particularly over the superior  aspect proximal to the patella.  With towel clips in place, I used a scalpel to excise the scar tissue around the patella circumferentially.  I was able to take this down visually to the quadriceps tendon.  Scarring on the inferior aspect of his patella  was taken  back to normal infrapatellar fat pad.  At this point, I completed the suprapatellar pouch synovectomy as well as laterally.  Once this was done, the knee was flexed with the patella laterally subluxed.  I removed the old polyethylene.  The  decision at this point was made based on examination under anesthesia with her leg  hyperextended at least 5 degrees with play of the medial and lateral collateral ligaments.  Given these findings, I did trial a 17.5 insert, but felt it too was still a  little lax and thus selected a 20 mm insert.  The knee was copiously irrigated with normal saline solution at this point.  The final 20 mm insert to match the left size 4 femur was opened and placed into the tibial tray and the knee was reduced.  The  knee was re-irrigated.  The tourniquet was let down after 20 minutes.  Hemostasis was obtained; however, there was a lot of punctate areas.  The extensor mechanism was then reapproximated with the knee in flexion using #1 Vicryl and #1 Stratafix suture.   The remainder of the wound was closed with 2-0 Vicryl and a running Monocryl stitch.  The knee was then cleaned, dried and dressed sterilely using surgical glue and Aquacel dressing.  She was brought to the recovery room in stable condition with plans  for observation stay with IV antibiotics.  Physical therapy will be utilized in the hospital.  HN/NUANCE  D:01/23/2021 T:01/23/2021 JOB:[id number]

## 2021-01-23 NOTE — Anesthesia Procedure Notes (Signed)
Spinal  Patient location during procedure: OR Staffing Performed: resident/CRNA  Resident/CRNA: Ridgely Anastacio A, CRNA Preanesthetic Checklist Completed: patient identified, IV checked, site marked, risks and benefits discussed, surgical consent, monitors and equipment checked, pre-op evaluation and timeout performed Spinal Block Patient position: sitting Prep: DuraPrep Patient monitoring: heart rate, cardiac monitor, continuous pulse ox and blood pressure Approach: midline Location: L3-4 Injection technique: single-shot Needle Needle type: Pencan  Needle gauge: 24 G Needle length: 10 cm Assessment Sensory level: T4     

## 2021-01-23 NOTE — Progress Notes (Signed)
Assisted Dr. Ellender with left, ultrasound guided, adductor canal block. Side rails up, monitors on throughout procedure. See vital signs in flow sheet. Tolerated Procedure well.  

## 2021-01-23 NOTE — Discharge Instructions (Signed)

## 2021-01-23 NOTE — Anesthesia Procedure Notes (Signed)
Anesthesia Regional Block: Adductor canal block   Pre-Anesthetic Checklist: ,, timeout performed, Correct Patient, Correct Site, Correct Laterality, Correct Procedure,, site marked, risks and benefits discussed, Surgical consent,  Pre-op evaluation,  At surgeon's request and post-op pain management  Laterality: Left  Prep: chloraprep       Needles:  Injection technique: Single-shot  Needle Type: Echogenic Stimulator Needle     Needle Length: 10cm  Needle Gauge: 20     Additional Needles:   Procedures:,,,, ultrasound used (permanent image in chart),,,,  Narrative:  Start time: 01/23/2021 10:10 AM End time: 01/23/2021 10:20 AM Injection made incrementally with aspirations every 5 mL.  Performed by: Personally  Anesthesiologist: Leonides Grills, MD  Additional Notes: Functioning IV was confirmed and monitors were applied. A time-out was performed. Hand hygiene and sterile gloves were used. The thigh was placed in a frog-leg position and prepped in a sterile fashion. A 20ga BBraun echogenic stimulator needle was placed using ultrasound guidance.  Negative aspiration and negative test dose prior to incremental administration of local anesthetic. The patient tolerated the procedure well.

## 2021-01-23 NOTE — Interval H&P Note (Signed)
History and Physical Interval Note:  01/23/2021 10:03 AM  Robin Evans  has presented today for surgery, with the diagnosis of Status post left kneee Arthroplasty with Patellar clunk.  The various methods of treatment have been discussed with the patient and family. After consideration of risks, benefits and other options for treatment, the patient has consented to  Procedure(s): SCAR DEBRIDEMENT OF TOTAL KNEE (Left) as a surgical intervention.  The patient's history has been reviewed, patient examined, no change in status, stable for surgery.  I have reviewed the patient's chart and labs.  Questions were answered to the patient's satisfaction.     Shelda Pal

## 2021-01-23 NOTE — Brief Op Note (Signed)
01/23/2021  11:23 AM  PATIENT:  Robin Evans  70 y.o. female  PRE-OPERATIVE DIAGNOSIS:  1. Status post left kneee Arthroplasty with Patellar clunk, excessive intra-articular scarring, 2. Left knee instability  POST-OPERATIVE DIAGNOSIS:  1. Status post left kneee Arthroplasty with Patellar clunk, excessive intra-articular scarring, 2 left knee instability   PROCEDURE:  Procedure(s): SCAR DEBRIDEMENT OF TOTAL KNEE (Left)  SURGEON:  Surgeon(s) and Role:    Durene Romans, MD - Primary  PHYSICIAN ASSISTANT: Lanney Gins, PA-C  ANESTHESIA:   regional and spinal  EBL:  < 100 cc   BLOOD ADMINISTERED:none  DRAINS: none   LOCAL MEDICATIONS USED:  MARCAINE     SPECIMEN:  No Specimen  DISPOSITION OF SPECIMEN:  N/A  COUNTS:  YES  TOURNIQUET:  20 min at 200 mmHg  DICTATION: .Other Dictation: Dictation Number [id number]  PLAN OF CARE: Admit for overnight observation  PATIENT DISPOSITION:  PACU - hemodynamically stable.   Delay start of Pharmacological VTE agent (>24hrs) due to surgical blood loss or risk of bleeding: no

## 2021-01-23 NOTE — Plan of Care (Signed)
  Problem: Education: Goal: Knowledge of General Education information will improve Description: Including pain rating scale, medication(s)/side effects and non-pharmacologic comfort measures Outcome: Progressing   Problem: Activity: Goal: Risk for activity intolerance will decrease Outcome: Progressing   Problem: Nutrition: Goal: Adequate nutrition will be maintained Outcome: Progressing   Problem: Elimination: Goal: Will not experience complications related to bowel motility Outcome: Progressing   Problem: Pain Managment: Goal: General experience of comfort will improve Outcome: Progressing   Problem: Education: Goal: Knowledge of the prescribed therapeutic regimen will improve Outcome: Progressing   Problem: Activity: Goal: Ability to avoid complications of mobility impairment will improve Outcome: Progressing   Problem: Pain Management: Goal: Pain level will decrease with appropriate interventions Outcome: Progressing   Problem: Skin Integrity: Goal: Will show signs of wound healing Outcome: Progressing

## 2021-01-23 NOTE — Anesthesia Preprocedure Evaluation (Addendum)
Anesthesia Evaluation  Patient identified by MRN, date of birth, ID band Patient awake    Reviewed: Allergy & Precautions, NPO status , Patient's Chart, lab work & pertinent test results  Airway Mallampati: III  TM Distance: >3 FB Neck ROM: Full    Dental no notable dental hx.    Pulmonary sleep apnea , former smoker,    Pulmonary exam normal breath sounds clear to auscultation       Cardiovascular hypertension, Pt. on medications Normal cardiovascular exam Rhythm:Regular Rate:Normal     Neuro/Psych negative neurological ROS  negative psych ROS   GI/Hepatic Neg liver ROS, hiatal hernia, GERD  Medicated and Controlled,  Endo/Other  Hypothyroidism   Renal/GU negative Renal ROS     Musculoskeletal  (+) Arthritis ,   Abdominal (+) + obese,   Peds  Hematology HLD   Anesthesia Other Findings Status post left kneee arthroplasty with patellar clunk  Reproductive/Obstetrics                            Anesthesia Physical Anesthesia Plan  ASA: II  Anesthesia Plan: Regional and Spinal   Post-op Pain Management:    Induction:   PONV Risk Score and Plan: 2 and Ondansetron, Dexamethasone, Midazolam and Treatment may vary due to age or medical condition  Airway Management Planned: Simple Face Mask  Additional Equipment:   Intra-op Plan:   Post-operative Plan:   Informed Consent: I have reviewed the patients History and Physical, chart, labs and discussed the procedure including the risks, benefits and alternatives for the proposed anesthesia with the patient or authorized representative who has indicated his/her understanding and acceptance.     Dental advisory given  Plan Discussed with: CRNA  Anesthesia Plan Comments:         Anesthesia Quick Evaluation

## 2021-01-24 ENCOUNTER — Encounter (HOSPITAL_COMMUNITY): Payer: Self-pay | Admitting: Orthopedic Surgery

## 2021-01-24 DIAGNOSIS — T84093A Other mechanical complication of internal left knee prosthesis, initial encounter: Secondary | ICD-10-CM | POA: Diagnosis not present

## 2021-01-24 DIAGNOSIS — E669 Obesity, unspecified: Secondary | ICD-10-CM | POA: Diagnosis present

## 2021-01-24 LAB — BASIC METABOLIC PANEL
Anion gap: 10 (ref 5–15)
BUN: 17 mg/dL (ref 8–23)
CO2: 24 mmol/L (ref 22–32)
Calcium: 8.3 mg/dL — ABNORMAL LOW (ref 8.9–10.3)
Chloride: 106 mmol/L (ref 98–111)
Creatinine, Ser: 0.9 mg/dL (ref 0.44–1.00)
GFR, Estimated: >60 mL/min (ref >60)
Glucose, Bld: 178 mg/dL — ABNORMAL HIGH (ref 70–99)
Potassium: 3.9 mmol/L (ref 3.5–5.1)
Sodium: 140 mmol/L (ref 135–145)

## 2021-01-24 LAB — CBC
HCT: 32.7 % — ABNORMAL LOW (ref 36.0–46.0)
Hemoglobin: 10.7 g/dL — ABNORMAL LOW (ref 12.0–15.0)
MCH: 30.1 pg (ref 26.0–34.0)
MCHC: 32.7 g/dL (ref 30.0–36.0)
MCV: 91.9 fL (ref 80.0–100.0)
Platelets: 161 10*3/uL (ref 150–400)
RBC: 3.56 MIL/uL — ABNORMAL LOW (ref 3.87–5.11)
RDW: 13.4 % (ref 11.5–15.5)
WBC: 8.7 10*3/uL (ref 4.0–10.5)
nRBC: 0 % (ref 0.0–0.2)

## 2021-01-24 MED ORDER — FERROUS SULFATE 325 (65 FE) MG PO TABS: 325.0000 mg | ORAL_TABLET | Freq: Three times a day (TID) | ORAL | 0 refills | Status: AC

## 2021-01-24 MED ORDER — ASPIRIN 81 MG PO CHEW: 81.0000 mg | CHEWABLE_TABLET | Freq: Two times a day (BID) | ORAL | 0 refills | Status: AC

## 2021-01-24 MED ORDER — DOCUSATE SODIUM 100 MG PO CAPS: 100.0000 mg | ORAL_CAPSULE | Freq: Two times a day (BID) | ORAL | 0 refills | Status: AC

## 2021-01-24 MED ORDER — HYDROCODONE-ACETAMINOPHEN 7.5-325 MG PO TABS
1.0000 | ORAL_TABLET | ORAL | 0 refills | Status: AC | PRN
Start: 2021-01-24 — End: ?

## 2021-01-24 MED ORDER — POLYETHYLENE GLYCOL 3350 17 G PO PACK: 17.0000 g | PACK | Freq: Two times a day (BID) | ORAL | 0 refills | Status: AC

## 2021-01-24 MED ORDER — METHOCARBAMOL 500 MG PO TABS: 500.0000 mg | ORAL_TABLET | Freq: Four times a day (QID) | ORAL | 0 refills | Status: AC | PRN

## 2021-01-24 MED ORDER — CELECOXIB 200 MG PO CAPS: 200.0000 mg | ORAL_CAPSULE | Freq: Two times a day (BID) | ORAL | 0 refills | Status: AC

## 2021-01-24 NOTE — Progress Notes (Signed)
Physical Therapy Treatment Patient Details Name: Robin Evans MRN: [id number] DOB: Dec 07, 1951 Today's Date: 01/24/2021    History of Present Illness 70 yo female sp L TK rev/poly exchange, scar tissue debridement 01/23/21. Hx of L TKA 2020, R TKA, back sg    PT Comments    Pt continues to progress well. Minimal pain with activity. Okay to d/c home from PT standpoint.    Follow Up Recommendations  Follow surgeon's recommendation for DC plan and follow-up therapies     Equipment Recommendations  None recommended by PT    Recommendations for Other Services       Precautions / Restrictions Precautions Precautions: Fall Restrictions Weight Bearing Restrictions: No Other Position/Activity Restrictions: WBAT    Mobility  Bed Mobility Overal bed mobility: Modified Independent                Transfers Overall transfer level: Needs assistance Equipment used: Rolling walker (2 wheeled) Transfers: Sit to/from Stand Sit to Stand: Supervision         General transfer comment: for safety. cues hand placement  Ambulation/Gait Ambulation/Gait assistance: Min guard Gait Distance (Feet): 185 Feet Assistive device: Rolling walker (2 wheeled) Gait Pattern/deviations: Step-through pattern;Decreased stride length     General Gait Details: Min guard for safety. Pt tolerated distance well. Pt denied lightheadedness   Stairs             Wheelchair Mobility    Modified Rankin (Stroke Patients Only)       Balance                                            Cognition Arousal/Alertness: Awake/alert Behavior During Therapy: WFL for tasks assessed/performed Overall Cognitive Status: Within Functional Limits for tasks assessed                                        Exercises Total Joint Exercises Goniometric ROM: ~5-75 degrees    General Comments        Pertinent Vitals/Pain Pain Assessment: 0-10 Pain Score: 3  Pain  Location: L knee Pain Descriptors / Indicators: Discomfort;Sore Pain Intervention(s): Monitored during session;Limited activity within patient's tolerance    Home Living                      Prior Function            PT Goals (current goals can now be found in the care plan section) Progress towards PT goals: Progressing toward goals    Frequency    7X/week      PT Plan Current plan remains appropriate    Co-evaluation              AM-PAC PT "6 Clicks" Mobility   Outcome Measure  Help needed turning from your back to your side while in a flat bed without using bedrails?: None Help needed moving from lying on your back to sitting on the side of a flat bed without using bedrails?: None Help needed moving to and from a bed to a chair (including a wheelchair)?: A Little Help needed standing up from a chair using your arms (e.g., wheelchair or bedside chair)?: A Little Help needed to walk in hospital room?: A Little Help needed climbing 3-5 steps with a  railing? : A Little 6 Click Score: 20    End of Session Equipment Utilized During Treatment: Gait belt Activity Tolerance: Patient tolerated treatment well Patient left: in chair;with call bell/phone within reach   PT Visit Diagnosis: Pain;Other abnormalities of gait and mobility (R26.89) Pain - Right/Left: Left Pain - part of body: Knee     Time: 9373-4287 PT Time Calculation (min) (ACUTE ONLY): 11 min  Charges:  $Gait Training: 8-22 mins                         Faye Ramsay, PT Acute Rehabilitation  Office: [phone] Pager: [phone]

## 2021-01-24 NOTE — Progress Notes (Signed)
     Subjective: 1 Day Post-Op Procedure(s) (LRB): SCAR DEBRIDEMENT OF TOTAL KNEE (Left)   Patient reports pain as mild, pain controlled.  No reported events throughout the night.  Discussed the procedure, findings and expectations moving forward.  Ready to be discharged home, if they do well with PT.  Follow up in the clinic in 2 weeks.  Knows to call with any questions or concerns.       Objective:   VITALS:   Vitals:   01/24/21 0515 01/24/21 0905  BP: 112/61 137/74  Pulse: 78 82  Resp: 16 16  Temp: 98.2 F (36.8 C) 98.5 F (36.9 C)  SpO2: 96% 96%    Dorsiflexion/Plantar flexion intact Incision: dressing C/D/I No cellulitis present Compartment soft  LABS Recent Labs    01/24/21 0313  HGB 10.7*  HCT 32.7*  WBC 8.7  PLT 161    Recent Labs    01/24/21 0313  NA 140  K 3.9  BUN 17  CREATININE 0.90  GLUCOSE 178*     Assessment/Plan: 1 Day Post-Op Procedure(s) (LRB): SCAR DEBRIDEMENT OF TOTAL KNEE (Left) Foley cath d/c'ed Advance diet Up with therapy D/C IV fluids Discharge home Follow up in 2 weeks at [facility] Follow up with OLIN,Suraya Vidrine D in 2 weeks.  Contact information:  [facility] [address] [place] [address] [phone]    Obese (BMI 30-39.9) Estimated body mass index is 32 kg/m as calculated from the following:   Height as of this encounter: 5\' 9"  (1.753 m).   Weight as of this encounter: 98.3 kg. Patient also counseled that weight may inhibit the healing process Patient counseled that losing weight will help with future health issues      PA-C  [facility]  [place] [address], [address], [place], [place] Phone: [phone] [email or web address] Facebook  [phone]

## 2021-01-24 NOTE — Plan of Care (Signed)
Pt is ready for DC home.

## 2021-01-24 NOTE — Discharge Summary (Signed)
Patient ID: Robin Evans MRN: [id number] DOB/AGE: [birth year] 70 y.o.  Admit date: 01/23/2021 Discharge date: 01/24/2021  Admission Diagnoses:  Principal Problem:   Arthrofibrosis of knee joint, left Active Problems:   Obese   Discharge Diagnoses:  Same  Past Medical History:  Diagnosis Date  . Arthritis   . Bladder incontinence    "i use to get botox injections for it but i havent been able to go back  since covid started "  . Dysrhythmia    occ palpitations  no cardiac dr-  . GERD (gastroesophageal reflux disease)   . Heart murmur    per patient , 3 month asgo her PCP Dr Carolyne Fiscal sent her for ECHO eval d/t auscultated murmur on assessment , per patient ECHO was norma   . History of hiatal hernia   . Hypertension   . Hypothyroidism   . Leg wound, left    injury occurred approx 3 weeks ago , area is dime sized , center is dark in color , appears to be instages of healing    . Palpitations    " i get them every once in while , very seldom, i dont even pay any attention to them anymore"   . Pneumonia 07/2020   with Covid  . Skin tear of left upper extremity    left anterior forearm , reports injury occurred on 07-21-2019 snagged her skin on a bolt on a fence  , wound is approx diamter of dime with observed erythemia yet cool to the touch ., patient is tending to wound with neosporin   . Sleep apnea    cpap, no device in use that this time     Surgeries: Procedure(s): LEFT SCAR DEBRIDEMENT OF TOTAL KNEE on 01/23/2021   Consultants: N/A  Discharged Condition: Improved  Hospital Course: Robin Evans is an 70 y.o. female who was admitted 01/23/2021 for operative treatment of Arthrofibrosis of knee joint, left. Patient has severe unremitting pain that affects sleep, daily activities, and work/hobbies. After pre-op clearance the patient was taken to the operating room on 01/23/2021 and underwent  Procedure(s): Left  SCAR DEBRIDEMENT OF TOTAL KNEE.    Patient was given perioperative  antibiotics:  Anti-infectives (From admission, onward)   Start     Dose/Rate Route Frequency Ordered Stop   01/23/21 1730  ceFAZolin (ANCEF) IVPB 2g/100 mL premix        2 g 200 mL/hr over 30 Minutes Intravenous Every 6 hours 01/23/21 1414 01/24/21 0043   01/23/21 0915  ceFAZolin (ANCEF) IVPB 2g/100 mL premix        2 g 200 mL/hr over 30 Minutes Intravenous On call to O.R. 01/23/21 0906 01/23/21 1123       Patient was given sequential compression devices, early ambulation, and chemoprophylaxis to prevent DVT.  Patient benefited maximally from hospital stay and there were no complications.    Recent vital signs:  Patient Vitals for the past 24 hrs:  BP Temp Temp src Pulse Resp SpO2 Height Weight  01/24/21 0905 137/74 98.5 F (36.9 C) Oral 82 16 96 % - -  01/24/21 0515 112/61 98.2 F (36.8 C) Oral 78 16 96 % - -  01/24/21 0156 (!) 113/55 98.3 F (36.8 C) Oral 85 17 95 % - -  01/23/21 2116 (!) 116/55 98 F (36.7 C) Oral 86 18 94 % - -  01/23/21 1752 (!) 144/79 98.2 F (36.8 C) Oral 91 16 100 % - -  01/23/21 1630 (!) 129/56 97.7 F (  36.5 C) Oral 78 16 100 % - -  01/23/21 1518 139/80 (!) 97.5 F (36.4 C) Oral 80 16 100 % - -  01/23/21 1414 135/67 97.8 F (36.6 C) Oral 73 18 100 % 5\' 9"  (1.753 m) 98.3 kg     Recent laboratory studies:  Recent Labs    01/24/21 0313  WBC 8.7  HGB 10.7*  HCT 32.7*  PLT 161  NA 140  K 3.9  CL 106  CO2 24  BUN 17  CREATININE 0.90  GLUCOSE 178*  CALCIUM 8.3*     Discharge Medications:   Allergies as of 01/24/2021      Reactions   Vancomycin Hives, Itching      Medication List    STOP taking these medications   acetaminophen 650 MG CR tablet Commonly known as: TYLENOL     TAKE these medications   ammonium lactate 12 % lotion Commonly known as: LAC-HYDRIN Apply 1 application topically 2 (two) times daily.   aspirin 81 MG chewable tablet Commonly known as: Aspirin Childrens Chew 1 tablet (81 mg total) by mouth 2 (two) times  daily. Take for 4 weeks, then resume regular dose. Start taking on: January 25, 2021   CALCIUM 600 + D PO Take 1 tablet by mouth daily.   celecoxib 200 MG capsule Commonly known as: CeleBREX Take 1 capsule (200 mg total) by mouth 2 (two) times daily.   docusate sodium 100 MG capsule Commonly known as: Colace Take 1 capsule (100 mg total) by mouth 2 (two) times daily.   DULoxetine 30 MG capsule Commonly known as: CYMBALTA Take 30 mg by mouth at bedtime.   ferrous sulfate 325 (65 FE) MG tablet Commonly known as: FerrouSul Take 1 tablet (325 mg total) by mouth 3 (three) times daily with meals for 14 days.   HYDROcodone-acetaminophen 7.5-325 MG tablet Commonly known as: Norco Take 1-2 tablets by mouth every 4 (four) hours as needed for moderate pain.   levothyroxine 125 MCG tablet Commonly known as: SYNTHROID Take 125 mcg by mouth daily before breakfast.   losartan-hydrochlorothiazide 100-12.5 MG tablet Commonly known as: HYZAAR Take 1 tablet by mouth daily.   methocarbamol 500 MG tablet Commonly known as: Robaxin Take 1 tablet (500 mg total) by mouth every 6 (six) hours as needed for muscle spasms.   omeprazole 20 MG capsule Commonly known as: PRILOSEC Take 20 mg by mouth daily as needed (heartburn/indigestion.).   polyethylene glycol 17 g packet Commonly known as: MIRALAX / GLYCOLAX Take 17 g by mouth 2 (two) times daily.   Potassium 99 MG Tabs Take 99 mg by mouth daily.   pravastatin 40 MG tablet Commonly known as: PRAVACHOL Take 40 mg by mouth every evening.   vitamin C with rose hips 500 MG tablet Take 500 mg by mouth daily.   zinc gluconate 50 MG tablet Take 50 mg by mouth daily.            Discharge Care Instructions  (From admission, onward)         Start     Ordered   01/24/21 0000  Change dressing       Comments: Maintain surgical dressing until follow up in the clinic. If the edges start to pull up, may reinforce with tape. If the  dressing is no longer working, may remove and cover with gauze and tape, but must keep the area dry and clean.  Call with any questions or concerns.   01/24/21 1149  Diagnostic Studies: No results found.  Disposition: Home  Discharge Instructions    Call MD / Call 911   Complete by: As directed    If you experience chest pain or shortness of breath, CALL 911 and be transported to the hospital emergency room.  If you develope a fever above 101 F, pus (white drainage) or increased drainage or redness at the wound, or calf pain, call your surgeon's office.   Change dressing   Complete by: As directed    Maintain surgical dressing until follow up in the clinic. If the edges start to pull up, may reinforce with tape. If the dressing is no longer working, may remove and cover with gauze and tape, but must keep the area dry and clean.  Call with any questions or concerns.   Constipation Prevention   Complete by: As directed    Drink plenty of fluids.  Prune juice may be helpful.  You may use a stool softener, such as Colace (over the counter) 100 mg twice a day.  Use MiraLax (over the counter) for constipation as needed.   Diet - low sodium heart healthy   Complete by: As directed    Discharge instructions   Complete by: As directed    Maintain surgical dressing until follow up in the clinic. If the edges start to pull up, may reinforce with tape. If the dressing is no longer working, may remove and cover with gauze and tape, but must keep the area dry and clean.  Follow up in 2 weeks at [facility]. Call with any questions or concerns.   Increase activity slowly as tolerated   Complete by: As directed    Weight bearing as tolerated with assist device (walker, cane, etc) as directed, use it as long as suggested by your surgeon or therapist, typically at least 4-6 weeks.   TED hose   Complete by: As directed    Use stockings (TED hose) for 2 weeks on both leg(s).  You may remove them  at night for sleeping.       Follow-up Information    Durene Romans, MD. Schedule an appointment as soon as possible for a visit in 2 weeks.   Specialty: Orthopedic Surgery Contact information: [address] [place] [address] [place] [address] [phone]                Signed: Genelle Gather [facility] 01/24/2021, 2:08 PM

## 2021-07-02 IMAGING — DX PORTABLE LEFT KNEE - 1-2 VIEW
2 series · 2 of 2 positions shown · non-contrast
Comparison: 03/07/2017.

CLINICAL DATA: Knee surgery.

EXAM:
PORTABLE LEFT KNEE - 1-2 VIEW

[knee ap]
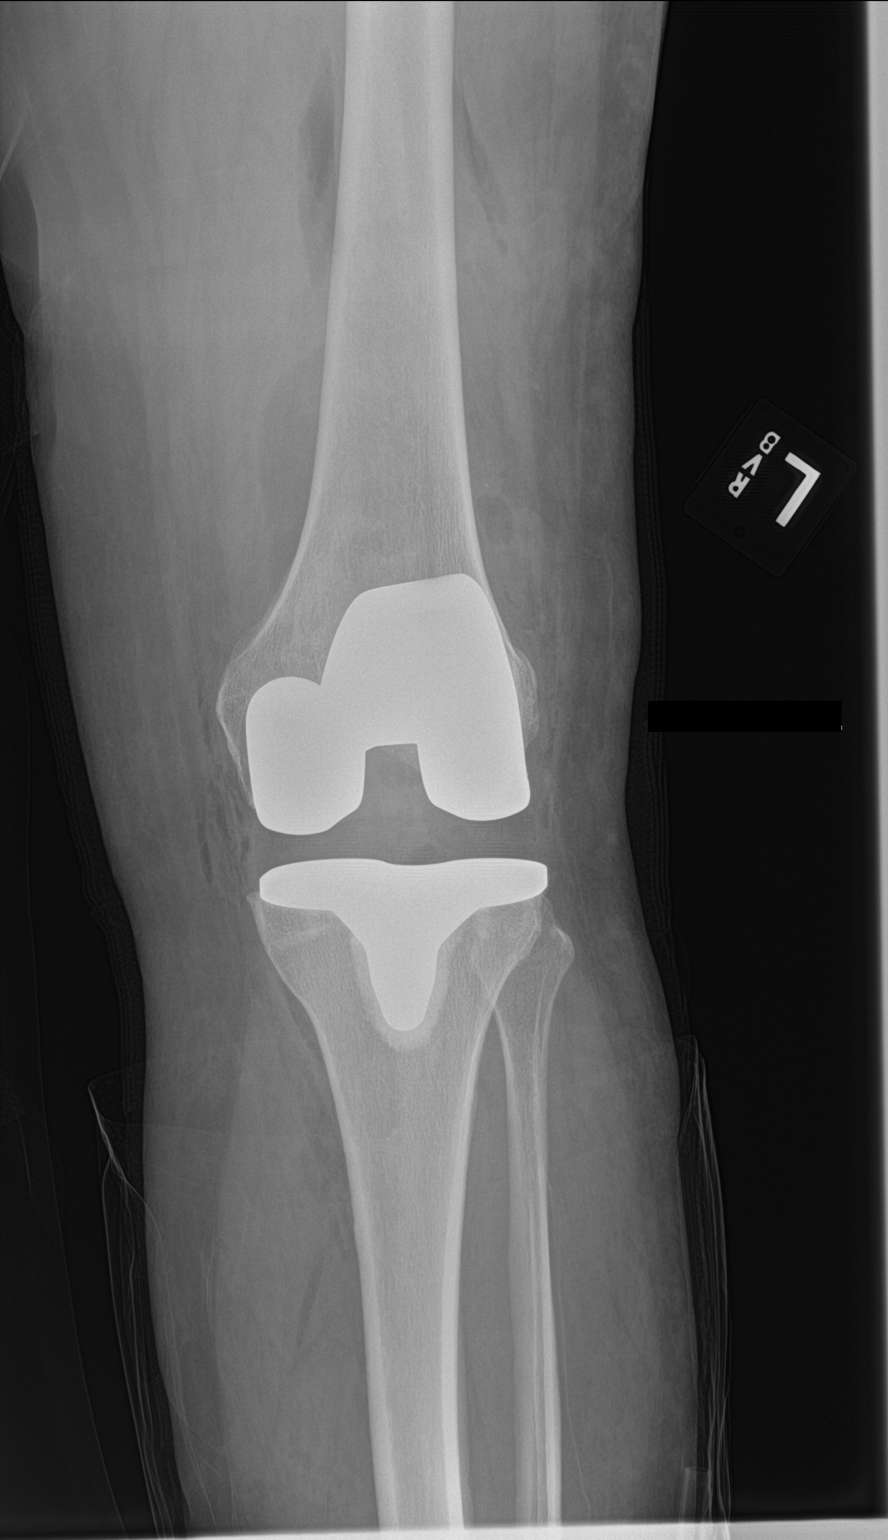

[knee lat]
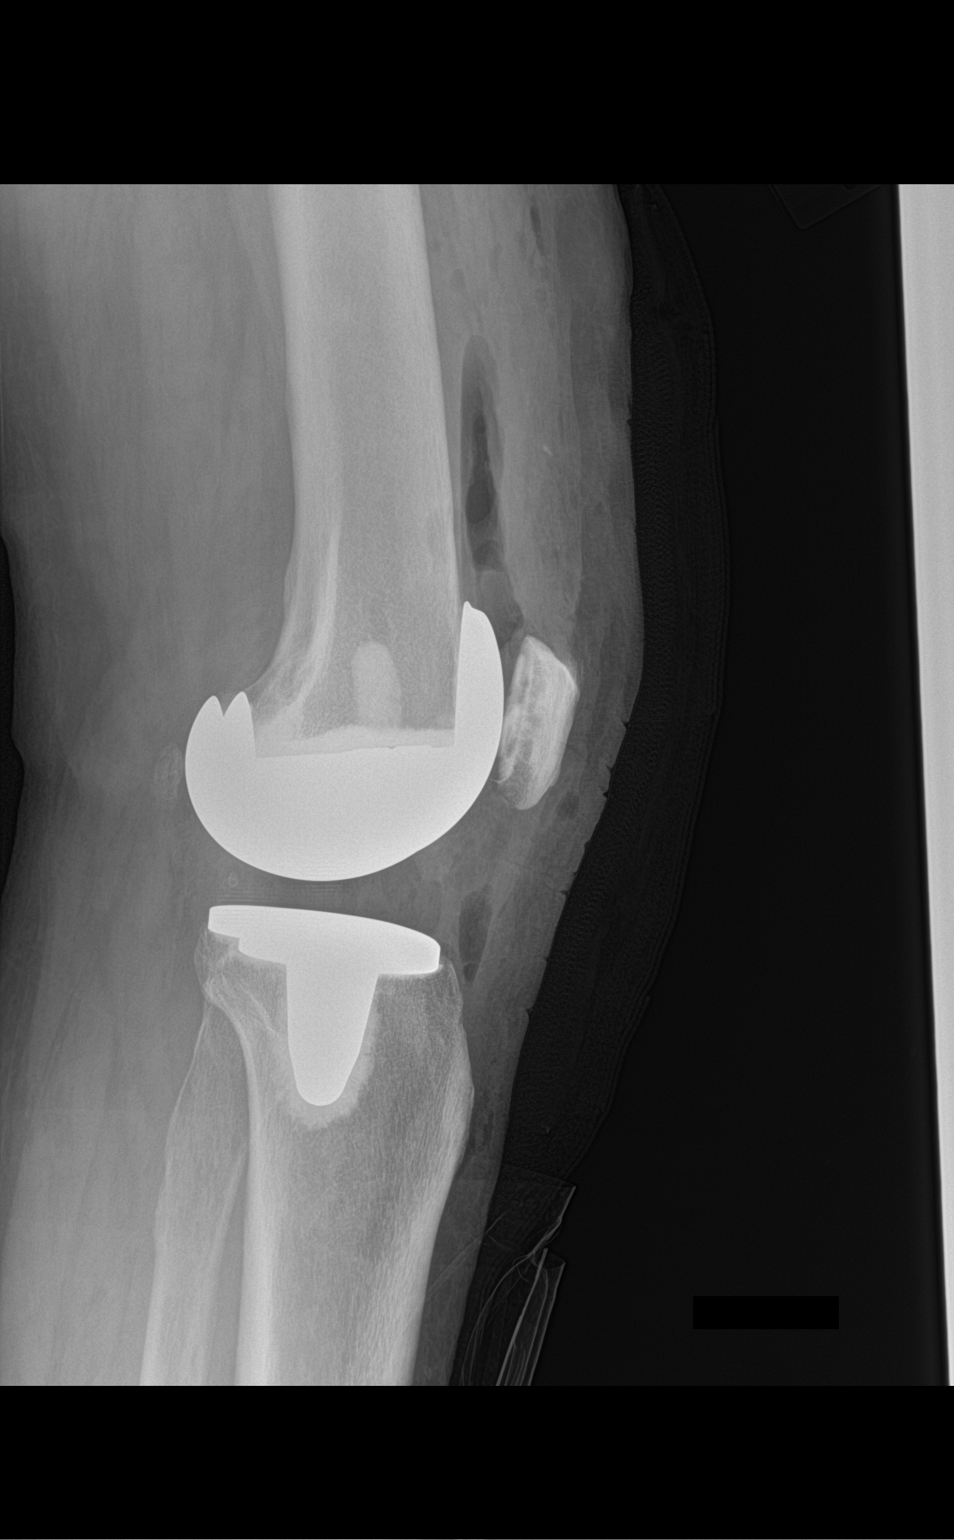

[2 of 2 positions shown; findings below may reference images not displayed]

FINDINGS: Total left knee replacement. Anatomic alignment. Hardware intact. No
acute bony abnormality.
IMPRESSION: Total left knee replacement with anatomic alignment.

## 2023-03-08 ENCOUNTER — Ambulatory Visit: Payer: Self-pay
